# Patient Record
Sex: Male | Born: 1990 | Race: White | Hispanic: No | Marital: Single | Smoking: Current some day smoker
Health system: Southern US, Community
[De-identification: ages and names within clinical notes are randomized; demographics above are authoritative.]

## PROBLEM LIST (undated history)

## (undated) HISTORY — PX: CHOLECYSTECTOMY: SHX55

---
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
## 2011-01-12 ENCOUNTER — Emergency Department: Payer: Self-pay | Admitting: Emergency Medicine

## 2012-02-18 ENCOUNTER — Emergency Department: Payer: Self-pay | Admitting: *Deleted

## 2012-09-26 ENCOUNTER — Emergency Department: Payer: Self-pay | Admitting: Emergency Medicine

## 2013-09-27 ENCOUNTER — Emergency Department: Payer: Self-pay | Admitting: Emergency Medicine

## 2013-09-27 LAB — URINALYSIS, COMPLETE
Bacteria: NONE SEEN
Bilirubin,UR: NEGATIVE
Blood: NEGATIVE
Glucose,UR: NEGATIVE mg/dL (ref 0–75)
Ketone: NEGATIVE
LEUKOCYTE ESTERASE: NEGATIVE
Nitrite: NEGATIVE
PH: 6 (ref 4.5–8.0)
PROTEIN: NEGATIVE
RBC,UR: 1 /HPF (ref 0–5)
Specific Gravity: 1.024 (ref 1.003–1.030)

## 2013-10-07 ENCOUNTER — Emergency Department: Payer: Self-pay | Admitting: Emergency Medicine

## 2013-10-07 LAB — URINALYSIS, COMPLETE
BILIRUBIN, UR: NEGATIVE
Bacteria: NONE SEEN
Blood: NEGATIVE
Glucose,UR: NEGATIVE mg/dL (ref 0–75)
Ketone: NEGATIVE
Leukocyte Esterase: NEGATIVE
Nitrite: NEGATIVE
PROTEIN: NEGATIVE
Ph: 6 (ref 4.5–8.0)
SQUAMOUS EPITHELIAL: NONE SEEN
Specific Gravity: 1.025 (ref 1.003–1.030)
WBC UR: 1 /HPF (ref 0–5)

## 2014-06-29 ENCOUNTER — Emergency Department: Payer: Self-pay | Admitting: Emergency Medicine

## 2014-08-30 ENCOUNTER — Emergency Department: Payer: Self-pay | Admitting: Emergency Medicine

## 2014-08-30 LAB — CBC WITH DIFFERENTIAL/PLATELET
Basophil #: 0 10*3/uL (ref 0.0–0.1)
Basophil %: 0.5 %
Eosinophil #: 0.4 10*3/uL (ref 0.0–0.7)
Eosinophil %: 4.4 %
HCT: 45.5 % (ref 40.0–52.0)
HGB: 15.3 g/dL (ref 13.0–18.0)
Lymphocyte #: 1.7 10*3/uL (ref 1.0–3.6)
Lymphocyte %: 19.4 %
MCH: 28.2 pg (ref 26.0–34.0)
MCHC: 33.5 g/dL (ref 32.0–36.0)
MCV: 84 fL (ref 80–100)
Monocyte #: 0.9 x10 3/mm (ref 0.2–1.0)
Monocyte %: 10 %
Neutrophil #: 5.9 10*3/uL (ref 1.4–6.5)
Neutrophil %: 65.7 %
Platelet: 187 10*3/uL (ref 150–440)
RBC: 5.42 10*6/uL (ref 4.40–5.90)
RDW: 13.5 % (ref 11.5–14.5)
WBC: 9 10*3/uL (ref 3.8–10.6)

## 2014-08-30 LAB — URINALYSIS, COMPLETE
Bacteria: NONE SEEN
Bilirubin,UR: NEGATIVE
Blood: NEGATIVE
Glucose,UR: NEGATIVE mg/dL (ref 0–75)
Ketone: NEGATIVE
Leukocyte Esterase: NEGATIVE
Nitrite: NEGATIVE
Ph: 6 (ref 4.5–8.0)
Protein: 30
RBC,UR: 2 /HPF (ref 0–5)
Specific Gravity: 1.031 (ref 1.003–1.030)
Squamous Epithelial: NONE SEEN
WBC UR: 2 /HPF (ref 0–5)

## 2014-08-30 LAB — COMPREHENSIVE METABOLIC PANEL
Albumin: 3.6 g/dL (ref 3.4–5.0)
Alkaline Phosphatase: 87 U/L
Anion Gap: 6 — ABNORMAL LOW (ref 7–16)
BUN: 12 mg/dL (ref 7–18)
Bilirubin,Total: 0.7 mg/dL (ref 0.2–1.0)
Calcium, Total: 8.8 mg/dL (ref 8.5–10.1)
Chloride: 105 mmol/L (ref 98–107)
Co2: 31 mmol/L (ref 21–32)
Creatinine: 1.13 mg/dL (ref 0.60–1.30)
EGFR (African American): >60
EGFR (Non-African Amer.): >60
Glucose: 98 mg/dL (ref 65–99)
Osmolality: 283 (ref 275–301)
Potassium: 3.9 mmol/L (ref 3.5–5.1)
SGOT(AST): 33 U/L (ref 15–37)
SGPT (ALT): 41 U/L
Sodium: 142 mmol/L (ref 136–145)
Total Protein: 7.1 g/dL (ref 6.4–8.2)

## 2014-08-30 LAB — LIPASE, BLOOD: LIPASE: 61 U/L — AB (ref 73–393)

## 2014-08-30 LAB — TROPONIN I: Troponin-I: <0.02 ng/mL

## 2014-09-02 LAB — BETA STREP CULTURE(ARMC)

## 2014-10-09 ENCOUNTER — Emergency Department: Payer: Self-pay | Admitting: Emergency Medicine

## 2014-10-09 LAB — CBC WITH DIFFERENTIAL/PLATELET
BASOS PCT: 0.3 %
Basophil #: 0 10*3/uL (ref 0.0–0.1)
EOS PCT: 0 %
Eosinophil #: 0 10*3/uL (ref 0.0–0.7)
HCT: 41.7 % (ref 40.0–52.0)
HGB: 13.9 g/dL (ref 13.0–18.0)
Lymphocyte #: 1.4 10*3/uL (ref 1.0–3.6)
Lymphocyte %: 12.2 %
MCH: 27.9 pg (ref 26.0–34.0)
MCHC: 33.4 g/dL (ref 32.0–36.0)
MCV: 84 fL (ref 80–100)
MONOS PCT: 7.7 %
Monocyte #: 0.9 x10 3/mm (ref 0.2–1.0)
NEUTROS ABS: 9.3 10*3/uL — AB (ref 1.4–6.5)
Neutrophil %: 79.8 %
Platelet: 205 10*3/uL (ref 150–440)
RBC: 5 10*6/uL (ref 4.40–5.90)
RDW: 13.7 % (ref 11.5–14.5)
WBC: 11.6 10*3/uL — AB (ref 3.8–10.6)

## 2014-10-09 LAB — COMPREHENSIVE METABOLIC PANEL
ANION GAP: 8 (ref 7–16)
AST: 28 U/L (ref 15–37)
Albumin: 3.8 g/dL (ref 3.4–5.0)
Alkaline Phosphatase: 97 U/L (ref 46–116)
BILIRUBIN TOTAL: 0.7 mg/dL (ref 0.2–1.0)
BUN: 10 mg/dL (ref 7–18)
Calcium, Total: 8.9 mg/dL (ref 8.5–10.1)
Chloride: 105 mmol/L (ref 98–107)
Co2: 27 mmol/L (ref 21–32)
Creatinine: 1.05 mg/dL (ref 0.60–1.30)
EGFR (Non-African Amer.): >60
Glucose: 98 mg/dL (ref 65–99)
Osmolality: 278 (ref 275–301)
POTASSIUM: 3.7 mmol/L (ref 3.5–5.1)
SGPT (ALT): 29 U/L (ref 14–63)
Sodium: 140 mmol/L (ref 136–145)
TOTAL PROTEIN: 7.6 g/dL (ref 6.4–8.2)

## 2014-10-09 LAB — PROTEIN, CSF: PROTEIN, CSF: 42 mg/dL (ref 15–45)

## 2014-10-09 LAB — URINALYSIS, COMPLETE
Bacteria: NONE SEEN
Bilirubin,UR: NEGATIVE
Blood: NEGATIVE
Glucose,UR: NEGATIVE mg/dL (ref 0–75)
Ketone: NEGATIVE
Leukocyte Esterase: NEGATIVE
Nitrite: NEGATIVE
PH: 7 (ref 4.5–8.0)
PROTEIN: NEGATIVE
RBC,UR: 1 /HPF (ref 0–5)
SPECIFIC GRAVITY: 1.015 (ref 1.003–1.030)
Squamous Epithelial: NONE SEEN
WBC UR: <1 /HPF (ref 0–5)

## 2014-10-09 LAB — CSF CELL COUNT WITH DIFFERENTIAL
CSF TUBE #: 1
CSF Tube #: 3
EOS PCT: 0 %
Eosinophil: 0 %
Lymphocytes: 0 %
Lymphocytes: 0 %
Monocytes/Macrophages: 0 %
Monocytes/Macrophages: 0 %
NEUTROS PCT: 0 %
NEUTROS PCT: 0 %
OTHER CELLS: 0 %
OTHER CELLS: 0 %
RBC (CSF): 2 /mm3
RBC (CSF): 68 /mm3
WBC (CSF): 0 /mm3
WBC (CSF): 0 /mm3

## 2014-10-09 LAB — GLUCOSE, CSF: Glucose, CSF: 59 mg/dL (ref 40–75)

## 2014-10-12 LAB — CSF CULTURE W GRAM STAIN

## 2015-07-14 ENCOUNTER — Encounter: Payer: Self-pay | Admitting: Emergency Medicine

## 2015-07-14 ENCOUNTER — Emergency Department
Admission: EM | Admit: 2015-07-14 | Discharge: 2015-07-14 | Disposition: A | Discharge status: Home / Self Care | Payer: Self-pay | Attending: Emergency Medicine | Admitting: Emergency Medicine

## 2015-07-14 DIAGNOSIS — R0602 Shortness of breath: Secondary | ICD-10-CM | POA: Insufficient documentation

## 2015-07-14 DIAGNOSIS — R531 Weakness: Secondary | ICD-10-CM | POA: Insufficient documentation

## 2015-07-14 DIAGNOSIS — F1210 Cannabis abuse, uncomplicated: Secondary | ICD-10-CM | POA: Insufficient documentation

## 2015-07-14 DIAGNOSIS — Z720 Tobacco use: Secondary | ICD-10-CM | POA: Insufficient documentation

## 2015-07-14 DIAGNOSIS — R55 Syncope and collapse: Secondary | ICD-10-CM | POA: Insufficient documentation

## 2015-07-14 DIAGNOSIS — H538 Other visual disturbances: Secondary | ICD-10-CM | POA: Insufficient documentation

## 2015-07-14 DIAGNOSIS — R112 Nausea with vomiting, unspecified: Secondary | ICD-10-CM | POA: Insufficient documentation

## 2015-07-14 LAB — COMPREHENSIVE METABOLIC PANEL
ALT: 17 U/L (ref 17–63)
AST: 19 U/L (ref 15–41)
Albumin: 3.9 g/dL (ref 3.5–5.0)
Alkaline Phosphatase: 67 U/L (ref 38–126)
Anion gap: 5 (ref 5–15)
BILIRUBIN TOTAL: 0.6 mg/dL (ref 0.3–1.2)
BUN: 14 mg/dL (ref 6–20)
CHLORIDE: 104 mmol/L (ref 101–111)
CO2: 31 mmol/L (ref 22–32)
CREATININE: 0.93 mg/dL (ref 0.61–1.24)
Calcium: 8.8 mg/dL — ABNORMAL LOW (ref 8.9–10.3)
GFR calc Af Amer: >60 mL/min (ref >60)
Glucose, Bld: 87 mg/dL (ref 65–99)
Potassium: 4.2 mmol/L (ref 3.5–5.1)
Sodium: 140 mmol/L (ref 135–145)
Total Protein: 6.6 g/dL (ref 6.5–8.1)

## 2015-07-14 LAB — URINE DRUG SCREEN, QUALITATIVE (ARMC ONLY)
AMPHETAMINES, UR SCREEN: NOT DETECTED
Barbiturates, Ur Screen: NOT DETECTED
Benzodiazepine, Ur Scrn: NOT DETECTED
COCAINE METABOLITE, UR ~~LOC~~: NOT DETECTED
Cannabinoid 50 Ng, Ur ~~LOC~~: POSITIVE — AB
MDMA (Ecstasy)Ur Screen: NOT DETECTED
METHADONE SCREEN, URINE: NOT DETECTED
OPIATE, UR SCREEN: NOT DETECTED
Phencyclidine (PCP) Ur S: NOT DETECTED
Tricyclic, Ur Screen: NOT DETECTED

## 2015-07-14 LAB — LIPASE, BLOOD: LIPASE: 21 U/L (ref 11–51)

## 2015-07-14 LAB — URINALYSIS COMPLETE WITH MICROSCOPIC (ARMC ONLY)
BILIRUBIN URINE: NEGATIVE
Bacteria, UA: NONE SEEN
GLUCOSE, UA: NEGATIVE mg/dL
Hgb urine dipstick: NEGATIVE
Ketones, ur: NEGATIVE mg/dL
Leukocytes, UA: NEGATIVE
Nitrite: NEGATIVE
Protein, ur: NEGATIVE mg/dL
Specific Gravity, Urine: 1.02 (ref 1.005–1.030)
pH: 6 (ref 5.0–8.0)

## 2015-07-14 LAB — CBC WITH DIFFERENTIAL/PLATELET
BASOS ABS: 0 10*3/uL (ref 0–0.1)
Basophils Relative: 1 %
EOS PCT: 1 %
Eosinophils Absolute: 0.1 10*3/uL (ref 0–0.7)
HCT: 47.9 % (ref 40.0–52.0)
Hemoglobin: 16.5 g/dL (ref 13.0–18.0)
LYMPHS ABS: 2.2 10*3/uL (ref 1.0–3.6)
LYMPHS PCT: 25 %
MCH: 28.4 pg (ref 26.0–34.0)
MCHC: 34.4 g/dL (ref 32.0–36.0)
MCV: 82.7 fL (ref 80.0–100.0)
MONO ABS: 0.6 10*3/uL (ref 0.2–1.0)
Monocytes Relative: 7 %
Neutro Abs: 5.8 10*3/uL (ref 1.4–6.5)
Neutrophils Relative %: 66 %
PLATELETS: 174 10*3/uL (ref 150–440)
RBC: 5.8 MIL/uL (ref 4.40–5.90)
RDW: 13.4 % (ref 11.5–14.5)
WBC: 8.7 10*3/uL (ref 3.8–10.6)

## 2015-07-14 MED ORDER — ONDANSETRON HCL 4 MG/2ML IJ SOLN
4.0000 mg | Freq: Once | INTRAMUSCULAR | Status: AC
  Administered 2015-07-14: 4 mg via INTRAVENOUS
  Filled 2015-07-14: qty 2

## 2015-07-14 MED ORDER — METOCLOPRAMIDE HCL 10 MG PO TABS: 10.0000 mg | ORAL_TABLET | Freq: Four times a day (QID) | ORAL | Status: DC | PRN

## 2015-07-14 MED ORDER — SODIUM CHLORIDE 0.9 % IV BOLUS (SEPSIS)
1000.0000 mL | Freq: Once | INTRAVENOUS | Status: AC
  Administered 2015-07-14: 1000 mL via INTRAVENOUS

## 2015-07-14 NOTE — Discharge Instructions (Signed)

## 2015-07-14 NOTE — ED Notes (Addendum)
Per MD request, PO challenge started. Pt was provided ginger ale per pt request. Will monitor pt intake and response.

## 2015-07-14 NOTE — ED Notes (Addendum)
Pt was ambulatory with a steady gait upon D/C. Pt's questions were answered and pt verbalized no further questions. Pt received discharge instructions and prescription and education about where to fill his prescription. Pt also received education about [facility].

## 2015-07-14 NOTE — ED Provider Notes (Signed)
[facility] Emergency Department Provider Note  ____________________________________________  Time seen: Approximately 6:45 PM  I have reviewed the triage vital signs and the nursing notes.   HISTORY  Chief Complaint Emesis    HPI Jackson Jimenez is a 24 y.o. male without any chronic medical issues who is presenting today with vomiting, weakness and blurred vision since yesterday. He says that he thinks this all began yesterday when he cooked eggs that had been in his refrigerator since July. He says that he vomited once. He says that he has been nauseous since denies any further vomiting. He says that he feels like his eyes are crossing and uncrossing and that is blurring his vision. Denies any blurred vision at this time. He denies any pain such as chest pain or abdominal pain. Is complaining of some shortness of breath with exertion. Also says that he had one episode of burning with urination after drinking a Coca-Cola. He denies any illicit drugs. Says that he had about 3 drinks of alcohol over the weekend. Says that he smokes every day. Says that earlier today he also had an episode where he thought he was going to pass out. He says that he felt "hot." However, did not feel his heart racing or any pain during this episode. Says that the symptoms were not worsened after he gave plasma today.   History reviewed. No pertinent past medical history.  There are no active problems to display for this patient.   History reviewed. No pertinent past surgical history.  No current outpatient prescriptions on file.  Allergies Albuterol  History reviewed. No pertinent family history.  Social History Social History  Substance Use Topics  . Smoking status: Current Every Day Smoker    Types: Cigarettes  . Smokeless tobacco: None  . Alcohol Use: No    Review of Systems Constitutional: No fever/chills Eyes: As above ENT: No sore throat. Cardiovascular: Denies  chest pain. Respiratory: As above  Gastrointestinal: No abdominal pain.    No diarrhea.  No constipation. Genitourinary: Negative for dysuria. Musculoskeletal: Negative for back pain. Skin: Negative for rash. Neurological: Negative for headaches, focal weakness or numbness.  10-point ROS otherwise negative.  ____________________________________________   PHYSICAL EXAM:  VITAL SIGNS: ED Triage Vitals  Enc Vitals Group     BP 07/14/15 1824 105/76 mmHg     Pulse Rate 07/14/15 1824 75     Resp 07/14/15 1824 16     Temp 07/14/15 1828 97.6 F (36.4 C)     Temp Source 07/14/15 1824 Oral     SpO2 07/14/15 1824 97 %     Weight 07/14/15 1824 229 lb (103.874 kg)     Height 07/14/15 1824 6\' 1"  (1.854 m)     Head Cir --      Peak Flow --      Pain Score --      Pain Loc --      Pain Edu? --      Excl. in GC? --     Constitutional: Alert and oriented. Well appearing and in no acute distress. Eyes: Conjunctivae are normal. PERRL. EOMI. Head: Atraumatic. Nose: No congestion/rhinnorhea. Mouth/Throat: Mucous membranes are moist.  Oropharynx non-erythematous. Neck: No stridor.   Cardiovascular: Normal rate, regular rhythm. Grossly normal heart sounds.  Good peripheral circulation. Respiratory: Normal respiratory effort.  No retractions. Lungs CTAB. Gastrointestinal: Soft and nontender. No distention. No abdominal bruits. No CVA tenderness. Musculoskeletal: No lower extremity tenderness nor edema.  No joint  effusions. Neurologic:  Normal speech and language. No gross focal neurologic deficits are appreciated. No gait instability. Skin:  Skin is warm, dry and intact. No rash noted. Psychiatric: Mood and affect are normal. Speech and behavior are normal.  ____________________________________________   LABS (all labs ordered are listed, but only abnormal results are displayed)  Labs Reviewed  COMPREHENSIVE METABOLIC PANEL - Abnormal; Notable for the following:    Calcium 8.8 (*)     All other components within normal limits  URINALYSIS COMPLETEWITH MICROSCOPIC ([facility] ONLY) - Abnormal; Notable for the following:    Color, Urine YELLOW (*)    APPearance CLEAR (*)    Squamous Epithelial / LPF 0-5 (*)    All other components within normal limits  URINE DRUG SCREEN, QUALITATIVE ([facility] ONLY) - Abnormal; Notable for the following:    Cannabinoid 50 Ng, Ur Butler POSITIVE (*)    All other components within normal limits  LIPASE, BLOOD  CBC WITH DIFFERENTIAL/PLATELET   ____________________________________________  EKG  ED ECG REPORT I, Mack Thurmon,  Teena Irani, the attending physician, personally viewed and interpreted this ECG.   Date: 07/14/2015  EKG Time: 1918  Rate: 67  Rhythm: normal EKG, normal sinus rhythm  Axis: Normal axis  Intervals:none  ST&T Change: No ST segment elevation or depression. No abnormal T-wave inversions.  ____________________________________________  RADIOLOGY   ____________________________________________   PROCEDURES    ____________________________________________   INITIAL IMPRESSION / ASSESSMENT AND PLAN / ED COURSE  Pertinent labs & imaging results that were available during my care of the patient were reviewed by me and considered in my medical decision making (see chart for details).  ----------------------------------------- 8:02 PM on 07/14/2015 -----------------------------------------  Patient is feeling improved at this time and was able to tolerate by mouth after fluids and Zofran. Has a very reassuring workup including his labs, urine and EKG. He says that he does have a roommate who is sick at this time with a very bad cold. It is possible he got a viral illness from the roommate or he is having malaise from food poisoning. We discussed using his way back into normal diet with bland foods such as toast and chicken soup. We also discussed the importance of fluid hydration including drinking plenty of water. The patient has  not complained of any further episodes of blurred vision. He'll be discharged to home. Patient was able to ambulate with a normal gait bathroom and back without any assistance. Likely near syncopal episodes related to vasovagal response from illness. ____________________________________________   FINAL CLINICAL IMPRESSION(S) / ED DIAGNOSES  Near syncope. Nausea and vomiting.    Myrna Blazer, MD 07/14/15 2004

## 2015-07-14 NOTE — ED Notes (Signed)
Pt complains of vomiting and blurred vision since yesterday. Pt gives plasma regularly and last gave today at 2pm. Pt stable at this time.

## 2015-11-22 ENCOUNTER — Emergency Department: Payer: Self-pay

## 2015-11-22 ENCOUNTER — Emergency Department
Admission: EM | Admit: 2015-11-22 | Discharge: 2015-11-22 | Disposition: A | Discharge status: Home / Self Care | Payer: Self-pay | Attending: Emergency Medicine | Admitting: Emergency Medicine

## 2015-11-22 DIAGNOSIS — Y9389 Activity, other specified: Secondary | ICD-10-CM | POA: Insufficient documentation

## 2015-11-22 DIAGNOSIS — M25561 Pain in right knee: Secondary | ICD-10-CM | POA: Insufficient documentation

## 2015-11-22 DIAGNOSIS — Y999 Unspecified external cause status: Secondary | ICD-10-CM | POA: Insufficient documentation

## 2015-11-22 DIAGNOSIS — S0083XA Contusion of other part of head, initial encounter: Secondary | ICD-10-CM | POA: Insufficient documentation

## 2015-11-22 DIAGNOSIS — Y92410 Unspecified street and highway as the place of occurrence of the external cause: Secondary | ICD-10-CM | POA: Insufficient documentation

## 2015-11-22 MED ORDER — IBUPROFEN 800 MG PO TABS
800.0000 mg | ORAL_TABLET | Freq: Once | ORAL | Status: DC
  Filled 2015-11-22: qty 1

## 2015-11-22 MED ORDER — IBUPROFEN 800 MG PO TABS: 800.0000 mg | ORAL_TABLET | Freq: Three times a day (TID) | ORAL | Status: DC | PRN

## 2015-11-22 MED ORDER — CYCLOBENZAPRINE HCL 10 MG PO TABS: 10.0000 mg | ORAL_TABLET | Freq: Three times a day (TID) | ORAL | Status: DC | PRN
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
## 2015-11-22 NOTE — Discharge Instructions (Signed)
Facial or Scalp Contusion °A facial or scalp contusion is a deep bruise on the face or head. Injuries to the face and head generally cause a lot of swelling, especially around the eyes. Contusions are the result of an injury that caused bleeding under the skin. The contusion may turn blue, purple, or yellow. Minor injuries will give you a painless contusion, but more severe contusions may stay painful and swollen for a few weeks.  °CAUSES  °A facial or scalp contusion is caused by a blunt injury or trauma to the face or head area.  °SIGNS AND SYMPTOMS  °· Swelling of the injured area.   °· Discoloration of the injured area.   °· Tenderness, soreness, or pain in the injured area.   °DIAGNOSIS  °The diagnosis can be made by taking a medical history and doing a physical exam. An X-ray exam, CT scan, or MRI may be needed to determine if there are any associated injuries, such as broken bones (fractures). °TREATMENT  °Often, the best treatment for a facial or scalp contusion is applying cold compresses to the injured area. Over-the-counter medicines may also be recommended for pain control.  °HOME CARE INSTRUCTIONS  °· Only take over-the-counter or prescription medicines as directed by your health care provider.   °· Apply ice to the injured area.   °· Put ice in a plastic bag.   °· Place a towel between your skin and the bag.   °· Leave the ice on for 20 minutes, 2-3 times a day.   °SEEK MEDICAL CARE IF: °· You have bite problems.   °· You have pain with chewing.   °· You are concerned about facial defects. °SEEK IMMEDIATE MEDICAL CARE IF: °· You have severe pain or a headache that is not relieved by medicine.   °· You have unusual sleepiness, confusion, or personality changes.   °· You throw up (vomit).   °· You have a persistent nosebleed.   °· You have double vision or blurred vision.   °· You have fluid drainage from your nose or ear.   °· You have difficulty walking or using your arms or legs.   °MAKE SURE YOU:   °· Understand these instructions. °· Will watch your condition. °· Will get help right away if you are not doing well or get worse. °  °This information is not intended to replace advice given to you by your health care provider. Make sure you discuss any questions you have with your health care provider. °  °Document Released: 10/06/2004 Document Revised: 09/19/2014 Document Reviewed: 04/11/2013 °Elsevier Interactive Patient Education ©2016 [employer] ° °Motor Vehicle Collision °It is common to have multiple bruises and sore muscles after a motor vehicle collision (MVC). These tend to feel worse for the first 24 hours. You may have the most stiffness and soreness over the first several hours. You may also feel worse when you wake up the first morning after your collision. After this point, you will usually begin to improve with each day. The speed of improvement often depends on the severity of the collision, the number of injuries, and the location and nature of these injuries. °HOME CARE INSTRUCTIONS °· Put ice on the injured area. °¨ Put ice in a plastic bag. °¨ Place a towel between your skin and the bag. °¨ Leave the ice on for 15-20 minutes, 3-4 times a day, or as directed by your health care provider. °· Drink enough fluids to keep your urine clear or pale yellow. Do not drink alcohol. °· Take a warm shower or bath once or twice   a day. This will increase blood flow to sore muscles. °· You may return to activities as directed by your caregiver. Be careful when lifting, as this may aggravate neck or back pain. °· Only take over-the-counter or prescription medicines for pain, discomfort, or fever as directed by your caregiver. Do not use aspirin. This may increase bruising and bleeding. °SEEK IMMEDIATE MEDICAL CARE IF: °· You have numbness, tingling, or weakness in the arms or legs. °· You develop severe headaches not relieved with medicine. °· You have severe neck pain, especially tenderness in the middle  of the back of your neck. °· You have changes in bowel or bladder control. °· There is increasing pain in any area of the body. °· You have shortness of breath, light-headedness, dizziness, or fainting. °· You have chest pain. °· You feel sick to your stomach (nauseous), throw up (vomit), or sweat. °· You have increasing abdominal discomfort. °· There is blood in your urine, stool, or vomit. °· You have pain in your shoulder (shoulder strap areas). °· You feel your symptoms are getting worse. °MAKE SURE YOU: °· Understand these instructions. °· Will watch your condition. °· Will get help right away if you are not doing well or get worse. °  °This information is not intended to replace advice given to you by your health care provider. Make sure you discuss any questions you have with your health care provider. °  °Document Released: 08/29/2005 Document Revised: 09/19/2014 Document Reviewed: 01/26/2011 °Elsevier Interactive Patient Education ©2016 [employer] ° °

## 2015-12-18 ENCOUNTER — Emergency Department
Admission: EM | Admit: 2015-12-18 | Discharge: 2015-12-18 | Disposition: A | Discharge status: Home / Self Care | Payer: Self-pay | Attending: Emergency Medicine | Admitting: Emergency Medicine

## 2015-12-18 DIAGNOSIS — M5412 Radiculopathy, cervical region: Secondary | ICD-10-CM | POA: Insufficient documentation

## 2015-12-18 DIAGNOSIS — F17210 Nicotine dependence, cigarettes, uncomplicated: Secondary | ICD-10-CM | POA: Insufficient documentation

## 2015-12-18 DIAGNOSIS — Z791 Long term (current) use of non-steroidal anti-inflammatories (NSAID): Secondary | ICD-10-CM | POA: Insufficient documentation

## 2015-12-18 DIAGNOSIS — Z79899 Other long term (current) drug therapy: Secondary | ICD-10-CM | POA: Insufficient documentation

## 2015-12-18 MED ORDER — KETOROLAC TROMETHAMINE 30 MG/ML IJ SOLN
30.0000 mg | Freq: Once | INTRAMUSCULAR | Status: AC
  Administered 2015-12-18: 30 mg via INTRAMUSCULAR
  Filled 2015-12-18: qty 1

## 2015-12-18 MED ORDER — MELOXICAM 15 MG PO TABS: 15.0000 mg | ORAL_TABLET | Freq: Every day | ORAL | Status: DC

## 2015-12-18 NOTE — ED Notes (Signed)
Pt is tender to touch at the left shoulder
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
## 2015-12-18 NOTE — ED Notes (Signed)
Pt states that today he used the bathroom and then sighed, pt states that he started to have excruciating pain in the center of his back, pt states that the pain wrapped around to his center, pt states that it took his breath and does when he moves a certain way, feels the pain in the ribs bilat, pt reports doing a lot of exercise last week but denies injury

## 2015-12-18 NOTE — ED Notes (Signed)
Pt arrives via ems for sudden onset of mid back pain that took his breath away, pt denies injury, reports doing 5 hours worth of exercise last week without injury, pt states that after he used the bathroom today he let out a big sigh that created an excruciating pain in the center of his back that radiates around to bilat ribs, pt reports a squeezing sensation in the center of his chest when it happened, pt states that with the ride here he has settled down and the pain has eased to a 3/10

## 2015-12-18 NOTE — Discharge Instructions (Signed)

## 2016-01-29 ENCOUNTER — Emergency Department: Payer: Self-pay

## 2016-01-29 ENCOUNTER — Emergency Department
Admission: EM | Admit: 2016-01-29 | Discharge: 2016-01-29 | Disposition: A | Discharge status: Home / Self Care | Payer: Self-pay | Attending: Emergency Medicine | Admitting: Emergency Medicine

## 2016-01-29 ENCOUNTER — Encounter: Payer: Self-pay | Admitting: Emergency Medicine

## 2016-01-29 DIAGNOSIS — Z87891 Personal history of nicotine dependence: Secondary | ICD-10-CM | POA: Insufficient documentation

## 2016-01-29 DIAGNOSIS — Y929 Unspecified place or not applicable: Secondary | ICD-10-CM | POA: Insufficient documentation

## 2016-01-29 DIAGNOSIS — X501XXA Overexertion from prolonged static or awkward postures, initial encounter: Secondary | ICD-10-CM | POA: Insufficient documentation

## 2016-01-29 DIAGNOSIS — Y9389 Activity, other specified: Secondary | ICD-10-CM | POA: Insufficient documentation

## 2016-01-29 DIAGNOSIS — Y990 Civilian activity done for income or pay: Secondary | ICD-10-CM | POA: Insufficient documentation

## 2016-01-29 DIAGNOSIS — S39012A Strain of muscle, fascia and tendon of lower back, initial encounter: Secondary | ICD-10-CM | POA: Insufficient documentation

## 2016-01-29 DIAGNOSIS — Z791 Long term (current) use of non-steroidal anti-inflammatories (NSAID): Secondary | ICD-10-CM | POA: Insufficient documentation

## 2016-01-29 LAB — URINALYSIS COMPLETE WITH MICROSCOPIC (ARMC ONLY)
Bilirubin Urine: NEGATIVE
GLUCOSE, UA: NEGATIVE mg/dL
Hgb urine dipstick: NEGATIVE
Ketones, ur: NEGATIVE mg/dL
Leukocytes, UA: NEGATIVE
NITRITE: NEGATIVE
Protein, ur: NEGATIVE mg/dL
SPECIFIC GRAVITY, URINE: 1.027 (ref 1.005–1.030)
pH: 6 (ref 5.0–8.0)

## 2016-01-29 MED ORDER — CYCLOBENZAPRINE HCL 5 MG PO TABS: 5.0000 mg | ORAL_TABLET | Freq: Three times a day (TID) | ORAL | Status: DC | PRN

## 2016-01-29 MED ORDER — KETOROLAC TROMETHAMINE 60 MG/2ML IM SOLN
30.0000 mg | Freq: Once | INTRAMUSCULAR | Status: AC
  Administered 2016-01-29: 30 mg via INTRAMUSCULAR
  Filled 2016-01-29: qty 2

## 2016-01-29 MED ORDER — KETOROLAC TROMETHAMINE 10 MG PO TABS: 10.0000 mg | ORAL_TABLET | Freq: Three times a day (TID) | ORAL | Status: DC

## 2016-01-29 NOTE — ED Notes (Addendum)
Patient to ER for c/o lower back pain. States "I started to speed up walking and felt a pop in my lower back". Patient arrived via [facility]. States pain shoots down to legs.

## 2016-01-29 NOTE — Discharge Instructions (Signed)
Back Injury Prevention °Back injuries can be very painful. They can also be difficult to heal. After having one back injury, you are more likely to injure your back again. It is important to learn how to avoid injuring or re-injuring your back. The following tips can help you to prevent a back injury. °WHAT SHOULD I KNOW ABOUT PHYSICAL FITNESS? °· Exercise for 30 minutes per day on most days of the week or as directed by your health care provider. Make sure to: °· Do aerobic exercises, such as walking, jogging, biking, or swimming. °· Do exercises that increase balance and strength, such as tai chi and yoga. These can decrease your risk of falling and injuring your back. °· Do stretching exercises to help with flexibility. °· Try to develop strong abdominal muscles. Your abdominal muscles provide a lot of the support that is needed by your back. °· Maintain a healthy weight.  This helps to decrease your risk of a back injury. °WHAT SHOULD I KNOW ABOUT MY DIET? °· Talk with your health care provider about your overall diet. Take supplements and vitamins only as directed by your health care provider. °· Talk with your health care provider about how much calcium and vitamin D you need each day. These nutrients help to prevent weakening of the bones (osteoporosis). Osteoporosis can cause broken (fractured) bones, which lead to back pain. °· Include good sources of calcium in your diet, such as dairy products, green leafy vegetables, and products that have had calcium added to them (fortified). °· Include good sources of vitamin D in your diet, such as milk and foods that are fortified with vitamin D. °WHAT SHOULD I KNOW ABOUT MY POSTURE? °· Sit up straight and stand up straight. Avoid leaning forward when you sit or hunching over when you stand. °· Choose chairs that have good low-back (lumbar) support. °· If you work at a desk, sit close to it so you do not need to lean over. Keep your chin tucked in. Keep your neck  drawn back, and keep your elbows bent at a right angle. Your arms should look like the letter "L." °· Sit high and close to the steering wheel when you drive. Add a lumbar support to your car seat, if needed. °· Avoid sitting or standing in one position for very long. Take breaks to get up, stretch, and walk around at least one time every hour. Take breaks every hour if you are driving for long periods of time. °· Sleep on your side with your knees slightly bent, or sleep on your back with a pillow under your knees. Do not lie on the front of your body to sleep. °WHAT SHOULD I KNOW ABOUT LIFTING, TWISTING, AND REACHING? °Lifting and Heavy Lifting °· Avoid heavy lifting, especially repetitive heavy lifting. If you must do heavy lifting: °· Stretch before lifting. °· Work slowly. °· Rest between lifts. °· Use a tool such as a cart or a dolly to move objects if one is available. °· Make several small trips instead of carrying one heavy load. °· Ask for help when you need it, especially when moving big objects. °· Follow these steps when lifting: °· Stand with your feet shoulder-width apart. °· Get as close to the object as you can. Do not try to pick up a heavy object that is far from your body. °· Use handles or lifting straps if they are available. °· Bend at your knees. Squat down, but keep your heels off the floor. °·   Keep your shoulders pulled back, your chin tucked in, and your back straight. °· Lift the object slowly while you tighten the muscles in your legs, abdomen, and buttocks. Keep the object as close to the center of your body as possible. °· Follow these steps when putting down a heavy load: °· Stand with your feet shoulder-width apart. °· Lower the object slowly while you tighten the muscles in your legs, abdomen, and buttocks. Keep the object as close to the center of your body as possible. °· Keep your shoulders pulled back, your chin tucked in, and your back straight. °· Bend at your knees. Squat  down, but keep your heels off the floor. °· Use handles or lifting straps if they are available. °Twisting and Reaching °· Avoid lifting heavy objects above your waist. °· Do not twist at your waist while you are lifting or carrying a load. If you need to turn, move your feet. °· Do not bend over without bending at your knees. °· Avoid reaching over your head, across a table, or for an object on a high surface. °WHAT ARE SOME OTHER TIPS? °· Avoid wet floors and icy ground. Keep sidewalks clear of ice to prevent falls. °· Do not sleep on a mattress that is too soft or too hard. °· Keep items that are used frequently within easy reach. °· Put heavier objects on shelves at waist level, and put lighter objects on lower or higher shelves. °· Find ways to decrease your stress, such as exercise, massage, or relaxation techniques. Stress can build up in your muscles. Tense muscles are more vulnerable to injury. °· Talk with your health care provider if you feel anxious or depressed. These conditions can make back pain worse. °· Wear flat heel shoes with cushioned soles. °· Avoid sudden movements. °· Use both shoulder straps when carrying a backpack. °· Do not use any tobacco products, including cigarettes, chewing tobacco, or electronic cigarettes. If you need help quitting, ask your health care provider. °  °This information is not intended to replace advice given to you by your health care provider. Make sure you discuss any questions you have with your health care provider. °  °Document Released: 10/06/2004 Document Revised: 01/13/2015 Document Reviewed: 09/02/2014 °Elsevier Interactive Patient Education ©2016 [employer] ° °Lumbosacral Strain °Lumbosacral strain is a strain of any of the parts that make up your lumbosacral vertebrae. Your lumbosacral vertebrae are the bones that make up the lower third of your backbone. Your lumbosacral vertebrae are held together by muscles and tough, fibrous tissue (ligaments).    °CAUSES  °A sudden blow to your back can cause lumbosacral strain. Also, anything that causes an excessive stretch of the muscles in the low back can cause this strain. This is typically seen when people exert themselves strenuously, fall, lift heavy objects, bend, or crouch repeatedly. °RISK FACTORS °· Physically demanding work. °· Participation in pushing or pulling sports or sports that require a sudden twist of the back (tennis, golf, baseball). °· Weight lifting. °· Excessive lower back curvature. °· Forward-tilted pelvis. °· Weak back or abdominal muscles or both. °· Tight hamstrings. °SIGNS AND SYMPTOMS  °Lumbosacral strain may cause pain in the area of your injury or pain that moves (radiates) down your leg.  °DIAGNOSIS °Your health care provider can often diagnose lumbosacral strain through a physical exam. In some cases, you may need tests such as X-ray exams.  °TREATMENT  °Treatment for your lower back injury depends on many factors that   your clinician will have to evaluate. However, most treatment will include the use of anti-inflammatory medicines. HOME CARE INSTRUCTIONS   Avoid hard physical activities (tennis, racquetball, waterskiing) if you are not in proper physical condition for it. This may aggravate or create problems.  If you have a back problem, avoid sports requiring sudden body movements. Swimming and walking are generally safer activities.  Maintain good posture.  Maintain a healthy weight.  For acute conditions, you may put ice on the injured area.  Put ice in a plastic bag.  Place a towel between your skin and the bag.  Leave the ice on for 20 minutes, 2-3 times a day.  When the low back starts healing, stretching and strengthening exercises may be recommended. SEEK MEDICAL CARE IF:  Your back pain is getting worse.  You experience severe back pain not relieved with medicines. SEEK IMMEDIATE MEDICAL CARE IF:   You have numbness, tingling, weakness, or problems  with the use of your arms or legs.  There is a change in bowel or bladder control.  You have increasing pain in any area of the body, including your belly (abdomen).  You notice shortness of breath, dizziness, or feel faint.  You feel sick to your stomach (nauseous), are throwing up (vomiting), or become sweaty.  You notice discoloration of your toes or legs, or your feet get very cold. MAKE SURE YOU:   Understand these instructions.  Will watch your condition.  Will get help right away if you are not doing well or get worse.   This information is not intended to replace advice given to you by your health care provider. Make sure you discuss any questions you have with your health care provider.   Document Released: 06/08/2005 Document Revised: 09/19/2014 Document Reviewed: 04/17/2013 Elsevier Interactive Patient Education 2016 [place] the prescription meds as directed. Hold the previously prescribed Meloxicam, while dosing the Ketorolac. Apply ice compresses to the back for additional pain relief. Follow-up with [facility] for continued symptom management.

## 2016-01-29 NOTE — ED Notes (Signed)
Patient left for xray.
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
## 2016-06-09 ENCOUNTER — Encounter: Payer: Self-pay | Admitting: Emergency Medicine

## 2016-06-09 ENCOUNTER — Emergency Department
Admission: EM | Admit: 2016-06-09 | Discharge: 2016-06-09 | Disposition: A | Discharge status: Home / Self Care | Payer: Self-pay | Attending: Emergency Medicine | Admitting: Emergency Medicine

## 2016-06-09 DIAGNOSIS — G8929 Other chronic pain: Secondary | ICD-10-CM | POA: Insufficient documentation

## 2016-06-09 DIAGNOSIS — M5442 Lumbago with sciatica, left side: Secondary | ICD-10-CM | POA: Insufficient documentation

## 2016-06-09 DIAGNOSIS — M549 Dorsalgia, unspecified: Secondary | ICD-10-CM

## 2016-06-09 DIAGNOSIS — Z87891 Personal history of nicotine dependence: Secondary | ICD-10-CM | POA: Insufficient documentation

## 2016-06-09 LAB — URINALYSIS COMPLETE WITH MICROSCOPIC (ARMC ONLY)
BILIRUBIN URINE: NEGATIVE
Bacteria, UA: NONE SEEN
Glucose, UA: NEGATIVE mg/dL
HGB URINE DIPSTICK: NEGATIVE
KETONES UR: NEGATIVE mg/dL
LEUKOCYTES UA: NEGATIVE
Nitrite: NEGATIVE
PH: 7 (ref 5.0–8.0)
Protein, ur: NEGATIVE mg/dL
SPECIFIC GRAVITY, URINE: 1.017 (ref 1.005–1.030)
Squamous Epithelial / LPF: NONE SEEN

## 2016-06-09 MED ORDER — KETOROLAC TROMETHAMINE 30 MG/ML IJ SOLN
30.0000 mg | Freq: Once | INTRAMUSCULAR | Status: AC
  Administered 2016-06-09: 30 mg via INTRAMUSCULAR
  Filled 2016-06-09: qty 1

## 2016-06-09 MED ORDER — DICLOFENAC SODIUM 50 MG PO TBEC: 50.0000 mg | DELAYED_RELEASE_TABLET | Freq: Two times a day (BID) | ORAL | 0 refills | Status: DC

## 2016-06-09 NOTE — ED Notes (Signed)
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 Patient states pain is at lower back and radiates forward. Pain does not currently radiate, but patient reports radiating down left leg yesterday.
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
## 2016-09-02 ENCOUNTER — Emergency Department: Payer: Self-pay

## 2016-09-02 ENCOUNTER — Emergency Department
Admission: EM | Admit: 2016-09-02 | Discharge: 2016-09-02 | Disposition: A | Discharge status: Home / Self Care | Payer: Self-pay | Attending: Student in an Organized Health Care Education/Training Program | Admitting: Student in an Organized Health Care Education/Training Program

## 2016-09-02 DIAGNOSIS — Z87891 Personal history of nicotine dependence: Secondary | ICD-10-CM | POA: Insufficient documentation

## 2016-09-02 DIAGNOSIS — L089 Local infection of the skin and subcutaneous tissue, unspecified: Secondary | ICD-10-CM | POA: Insufficient documentation

## 2016-09-02 DIAGNOSIS — S40021A Contusion of right upper arm, initial encounter: Secondary | ICD-10-CM | POA: Insufficient documentation

## 2016-09-02 DIAGNOSIS — Z79899 Other long term (current) drug therapy: Secondary | ICD-10-CM | POA: Insufficient documentation

## 2016-09-02 DIAGNOSIS — N23 Unspecified renal colic: Secondary | ICD-10-CM | POA: Insufficient documentation

## 2016-09-02 DIAGNOSIS — Y999 Unspecified external cause status: Secondary | ICD-10-CM | POA: Insufficient documentation

## 2016-09-02 DIAGNOSIS — Y92410 Unspecified street and highway as the place of occurrence of the external cause: Secondary | ICD-10-CM | POA: Insufficient documentation

## 2016-09-02 DIAGNOSIS — B958 Unspecified staphylococcus as the cause of diseases classified elsewhere: Secondary | ICD-10-CM

## 2016-09-02 DIAGNOSIS — X58XXXA Exposure to other specified factors, initial encounter: Secondary | ICD-10-CM | POA: Insufficient documentation

## 2016-09-02 DIAGNOSIS — Y9389 Activity, other specified: Secondary | ICD-10-CM | POA: Insufficient documentation

## 2016-09-02 DIAGNOSIS — S20211A Contusion of right front wall of thorax, initial encounter: Secondary | ICD-10-CM | POA: Insufficient documentation

## 2016-09-02 MED ORDER — TRAMADOL HCL 50 MG PO TABS: 50.0000 mg | ORAL_TABLET | Freq: Four times a day (QID) | ORAL | 0 refills | Status: DC | PRN

## 2016-09-02 MED ORDER — NAPROXEN 500 MG PO TABS
500.0000 mg | ORAL_TABLET | Freq: Once | ORAL | Status: AC
  Administered 2016-09-02: 500 mg via ORAL
  Filled 2016-09-02: qty 1

## 2016-09-02 MED ORDER — SULFAMETHOXAZOLE-TRIMETHOPRIM 800-160 MG PO TABS
1.0000 | ORAL_TABLET | Freq: Once | ORAL | Status: AC
  Administered 2016-09-02: 1 via ORAL
  Filled 2016-09-02: qty 1

## 2016-09-02 MED ORDER — BACITRACIN-NEOMYCIN-POLYMYXIN 400-5-5000 EX OINT
TOPICAL_OINTMENT | Freq: Once | CUTANEOUS | Status: AC
  Administered 2016-09-02: 1 via TOPICAL
  Filled 2016-09-02: qty 1

## 2016-09-02 MED ORDER — NAPROXEN 500 MG PO TABS: 500.0000 mg | ORAL_TABLET | Freq: Two times a day (BID) | ORAL | Status: DC

## 2016-09-02 MED ORDER — SULFAMETHOXAZOLE-TRIMETHOPRIM 800-160 MG PO TABS: 1.0000 | ORAL_TABLET | Freq: Two times a day (BID) | ORAL | 0 refills | Status: DC

## 2016-09-02 MED ORDER — TRAMADOL HCL 50 MG PO TABS
50.0000 mg | ORAL_TABLET | Freq: Once | ORAL | Status: AC
  Administered 2016-09-02: 50 mg via ORAL
  Filled 2016-09-02: qty 1

## 2016-09-02 MED ORDER — NEOMYCIN-POLYMYXIN-PRAMOXINE 1 % EX CREA: TOPICAL_CREAM | Freq: Two times a day (BID) | CUTANEOUS | 0 refills | Status: DC

## 2016-09-02 NOTE — ED Triage Notes (Addendum)
Pt c/o abrasion that is "in a line" going up inner left forearm, itching. Denies pain to arm. Rib pain only in AM upon awakening then goes away X 1 week. Bruise to upper right bicep, unsure of how he got it. Pt alert and oriented X4, active, cooperative, pt in NAD. RR even and unlabored, color WNL.

## 2016-09-02 NOTE — ED Notes (Signed)
Patient denies any  Current rib pain, reports rib pain only when he first wakes up. Pt currently c/o headache, abrasio and bruise

## 2016-09-02 NOTE — ED Notes (Signed)
Pt c/o right rib pain, bruise to right upper arm and abrasion to left arm X 1 week.
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
## 2016-09-02 NOTE — ED Notes (Signed)
Pt discharged home after verbalizing understanding of discharge instructions; nad noted. 

## 2016-09-02 NOTE — ED Provider Notes (Signed)
[facility] Emergency Department Provider Note   ____________________________________________   None    (approximate)  I have reviewed the triage vital signs and the nursing notes.   HISTORY  Chief Complaint Abrasion and Bleeding/Bruising    HPI Jackson Jimenez is a 25 y.o. male patient complain abrasion to the left forearm with a rate street traveling up the arm. Patient states he was helping his friend daughter move and push with thorns Embedded into his lower left forearm. Patient states since the incident. He stated intermittent itching and warmth to the area. Patient also states a bruise to the right upper arm and right rib pain but states cannot recall any provocative incident for this complaint. Patient's stay rib pain increase with palpation and deep inspirations. No palliative measures for each of these complaints. Patient rates his rib pain as a 5 over 5. Patient rates his left arm pain as a 2/5. No past medical history on file.  There are no active problems to display for this patient.   History reviewed. No pertinent surgical history.  Prior to Admission medications   Medication Sig Start Date End Date Taking? Authorizing Provider  diclofenac (VOLTAREN) 50 MG EC tablet Take 1 tablet (50 mg total) by mouth 2 (two) times daily. 06/09/16   Tommi Rumpshonda L Summers, PA-C  metoCLOPramide (REGLAN) 10 MG tablet Take 1 tablet (10 mg total) by mouth every 6 (six) hours as needed for nausea or vomiting. 07/14/15   Myrna Blazeravid Matthew Schaevitz, MD  naproxen (NAPROSYN) 500 MG tablet Take 1 tablet (500 mg total) by mouth 2 (two) times daily with a meal. 09/02/16   Joni Reiningonald K Lakena Sparlin, PA-C  neomycin-polymyxin-pramoxine (NEOSPORIN PLUS) 1 % cream Apply topically 2 (two) times daily. 09/02/16   Joni Reiningonald K Miquel Lamson, PA-C  sulfamethoxazole-trimethoprim (BACTRIM DS,SEPTRA DS) 800-160 MG tablet Take 1 tablet by mouth 2 (two) times daily. 09/02/16   Joni Reiningonald K Keyanni Whittinghill, PA-C  traMADol (ULTRAM)  50 MG tablet Take 1 tablet (50 mg total) by mouth every 6 (six) hours as needed for moderate pain. 09/02/16   Joni Reiningonald K Maxtyn Nuzum, PA-C    Allergies Albuterol  No family history on file.  Social History Social History  Substance Use Topics  . Smoking status: Former Smoker    Types: Cigarettes  . Smokeless tobacco: Not on file  . Alcohol use No    Review of Systems Constitutional: No fever/chills Eyes: No visual changes. ENT: No sore throat. Cardiovascular: Denies chest pain. Respiratory: Denies shortness of breath. Gastrointestinal: No abdominal pain.  No nausea, no vomiting.  No diarrhea.  No constipation. Genitourinary: Negative for dysuria. Musculoskeletal: Right renal pain  Skin: Negative for rash. Abrasion and redness to the forearm. Neurological: Negative for headaches, focal weakness or numbness. Allergic/Immunilogical: Albuterol _______________________________________   PHYSICAL EXAM:  VITAL SIGNS: ED Triage Vitals [09/02/16 1703]  Enc Vitals Group     BP (!) 141/77     Pulse Rate 71     Resp 16     Temp 98.4 F (36.9 C)     Temp Source Oral     SpO2 99 %     Weight 205 lb (93 kg)     Height 6' (1.829 m)     Head Circumference      Peak Flow      Pain Score      Pain Loc      Pain Edu?      Excl. in GC?     Constitutional: Alert  and oriented. Well appearing and in no acute distress. Eyes: Conjunctivae are normal. PERRL. EOMI. Head: Atraumatic. Nose: No congestion/rhinnorhea. Mouth/Throat: Mucous membranes are moist.  Oropharynx non-erythematous. Neck: No stridor.  No cervical spine tenderness to palpation. Hematological/Lymphatic/Immunilogical: No cervical lymphadenopathy. Cardiovascular: Normal rate, regular rhythm. Grossly normal heart sounds.  Good peripheral circulation. Respiratory: Normal respiratory effort.  No retractions. Lungs CTAB. Gastrointestinal: Soft and nontender. No distention. No abdominal bruits. No CVA  tenderness. Musculoskeletal:No obvious chest wall deformity. Equal S1 expansion. Patient has moderate guarding palpation of the right lateral thoracic area. Neurologic:  Normal speech and language. No gross focal neurologic deficits are appreciated. No gait instability. Skin:  Skin is warm, dry and intact. No rash noted. Ecchymosis lateral right arm. Abrasion was signs of excoriation to the left forearm. Psychiatric: Mood and affect are normal. Speech and behavior are normal.  ____________________________________________   LABS (all labs ordered are listed, but only abnormal results are displayed)  Labs Reviewed - No data to display ____________________________________________  EKG   ____________________________________________  RADIOLOGY  No acute findings x-ray of the right wrist. ____________________________________________   PROCEDURES  Procedure(s) performed: None  Procedures  Critical Care performed: No  ____________________________________________   INITIAL IMPRESSION / ASSESSMENT AND PLAN / ED COURSE  Pertinent labs & imaging results that were available during my care of the patient were reviewed by me and considered in my medical decision making (see chart for details).  Contusion to right upper arm and right lateral ribs. Skin infection left forearm. Patient given discharge care instructions. Patient given a prescription for Bactrim DS, naproxen, tramadol, and Neosporin. Ad[facility].  Clinical Course      ____________________________________________   FINAL CLINICAL IMPRESSION(S) / ED DIAGNOSES  Final diagnoses:  Staph skin infection  Rib contusion, right, initial encounter      NEW MEDICATIONS STARTED DURING THIS VISIT:  New Prescriptions   NAPROXEN (NAPROSYN) 500 MG TABLET    Take 1 tablet (500 mg total) by mouth 2 (two) times daily with a meal.   NEOMYCIN-POLYMYXIN-PRAMOXINE (NEOSPORIN  PLUS) 1 % CREAM    Apply topically 2 (two) times daily.   SULFAMETHOXAZOLE-TRIMETHOPRIM (BACTRIM DS,SEPTRA DS) 800-160 MG TABLET    Take 1 tablet by mouth 2 (two) times daily.   TRAMADOL (ULTRAM) 50 MG TABLET    Take 1 tablet (50 mg total) by mouth every 6 (six) hours as needed for moderate pain.     Note:  This document was prepared using Dragon voice recognition software and may include unintentional dictation errors.    Joni Reiningonald K Jaylyn Booher, PA-C 09/02/16 1807    Willy EddyPatrick Robinson, MD 09/02/16 [phone]

## 2016-09-06 ENCOUNTER — Emergency Department
Admission: EM | Admit: 2016-09-06 | Discharge: 2016-09-06 | Disposition: A | Discharge status: Home / Self Care | Payer: Self-pay | Attending: Emergency Medicine | Admitting: Emergency Medicine

## 2016-09-06 DIAGNOSIS — L2389 Allergic contact dermatitis due to other agents: Secondary | ICD-10-CM | POA: Insufficient documentation

## 2016-09-06 DIAGNOSIS — Z87891 Personal history of nicotine dependence: Secondary | ICD-10-CM | POA: Insufficient documentation

## 2016-09-06 DIAGNOSIS — Z79899 Other long term (current) drug therapy: Secondary | ICD-10-CM | POA: Insufficient documentation

## 2016-09-06 MED ORDER — DIPHENHYDRAMINE HCL 25 MG PO CAPS: 25.0000 mg | ORAL_CAPSULE | ORAL | 2 refills | Status: DC | PRN

## 2016-09-06 MED ORDER — RANITIDINE HCL 300 MG PO TABS: 300.0000 mg | ORAL_TABLET | Freq: Every day | ORAL | 1 refills | Status: DC

## 2016-09-06 MED ORDER — CEPHALEXIN 500 MG PO CAPS: 500.0000 mg | ORAL_CAPSULE | Freq: Two times a day (BID) | ORAL | 0 refills | Status: AC

## 2016-09-06 MED ORDER — HYDROCORTISONE 2 % EX LOTN: 1.0000 "application " | TOPICAL_LOTION | Freq: Two times a day (BID) | CUTANEOUS | 0 refills | Status: DC

## 2016-09-06 NOTE — ED Notes (Signed)
Pt was seen a few days ago for rash/scratch to left arm, pt reports arm is worse today , with more redness

## 2016-09-06 NOTE — ED Triage Notes (Signed)
Pt here with continued rash, redness and swelling to left arm. Pt was recently tx for "staff infection".

## 2016-09-06 NOTE — ED Provider Notes (Signed)
[facility] Emergency Department Provider Note  ____________________________________________  Time seen: Approximately 6:10 PM  I have reviewed the triage vital signs and the nursing notes.   HISTORY  Chief Complaint Rash   HPI Jackson Jimenez is a 25 y.o. male presenting to the emergency department with left volar forearm rash. Patient was seen on 09/02/2016 after localized erythema occurred from scratching a wound from a "bur bush". Patient states that he was treated with Neosporin, Bactrim and naproxen. Patient states that he has been taking his antibiotic as directed. However, today when he was changing his bandage he noticed erythema and "bumps" localized above scratch sites. Patient became concerned. He denies fever or changes in energy. Patient has been applying Neosporin as directed.  No past medical history on file.  There are no active problems to display for this patient.   No past surgical history on file.  Prior to Admission medications   Medication Sig Start Date End Date Taking? Authorizing Provider  cephALEXin (KEFLEX) 500 MG capsule Take 1 capsule (500 mg total) by mouth 2 (two) times daily. 09/06/16 09/16/16  Orvil FeilJaclyn M Gaetano Romberger, PA-C  diclofenac (VOLTAREN) 50 MG EC tablet Take 1 tablet (50 mg total) by mouth 2 (two) times daily. 06/09/16   Tommi Rumpshonda L Summers, PA-C  diphenhydrAMINE (BENADRYL) 25 mg capsule Take 1 capsule (25 mg total) by mouth every 4 (four) hours as needed. 09/06/16 09/16/16  Dayna BarkerJaclyn M Timira Bieda, PA-C  HYDROCORTISONE, TOPICAL, 2 % LOTN Apply 1 application topically 2 (two) times daily. 09/06/16   Orvil FeilJaclyn M Ariahna Smiddy, PA-C  metoCLOPramide (REGLAN) 10 MG tablet Take 1 tablet (10 mg total) by mouth every 6 (six) hours as needed for nausea or vomiting. 07/14/15   Myrna Blazeravid Matthew Schaevitz, MD  naproxen (NAPROSYN) 500 MG tablet Take 1 tablet (500 mg total) by mouth 2 (two) times daily with a meal. 09/02/16   Joni Reiningonald K Smith, PA-C   neomycin-polymyxin-pramoxine (NEOSPORIN PLUS) 1 % cream Apply topically 2 (two) times daily. 09/02/16   Joni Reiningonald K Smith, PA-C  ranitidine (ZANTAC) 300 MG tablet Take 1 tablet (300 mg total) by mouth at bedtime. 09/06/16 09/06/17  Orvil FeilJaclyn M Terica Yogi, PA-C  sulfamethoxazole-trimethoprim (BACTRIM DS,SEPTRA DS) 800-160 MG tablet Take 1 tablet by mouth 2 (two) times daily. 09/02/16   Joni Reiningonald K Smith, PA-C  traMADol (ULTRAM) 50 MG tablet Take 1 tablet (50 mg total) by mouth every 6 (six) hours as needed for moderate pain. 09/02/16   Joni Reiningonald K Smith, PA-C    Allergies Albuterol  No family history on file.  Social History Social History  Substance Use Topics  . Smoking status: Former Smoker    Types: Cigarettes  . Smokeless tobacco: Not on file  . Alcohol use No    Review of Systems  Constitutional: Negative for fever/chills Respiratory: Negative for shortness of breath. Musculoskeletal: Negative  for pain. Skin: Patient has rash.  Neurological: Negative for headaches, focal weakness or numbness. ____________________________________________   PHYSICAL EXAM:  VITAL SIGNS: ED Triage Vitals  Enc Vitals Group     BP 09/06/16 1547 119/72     Pulse Rate 09/06/16 1547 67     Resp 09/06/16 1547 18     Temp 09/06/16 1547 98.2 F (36.8 C)     Temp Source 09/06/16 1547 Oral     SpO2 09/06/16 1547 100 %     Weight 09/06/16 1548 200 lb (90.7 kg)     Height 09/06/16 1548 6' (1.829 m)     Head  Circumference --      Peak Flow --      Pain Score 09/06/16 1554 5     Pain Loc --      Pain Edu? --      Excl. in GC? --      Constitutional: Alert and oriented. Well appearing and in no acute distress. Cardiovascular: Good peripheral circulation. No murmurs, gallops or rubs. Respiratory: Normal respiratory effort.  No retractions. Lungs CTAB. Musculoskeletal: FROM throughout. Neurologic:  Normal speech and language. No gross focal neurologic deficits are appreciated. Skin:  Patient has a linear  scratch along the volar aspect of his left forearm. There is mild erythema with macular rash above scratch. No vesicular formation. No streaking.  ____________________________________________   LABS (all labs ordered are listed, but only abnormal results are displayed)  Labs Reviewed - No data to display ____________________________________________  EKG   ____________________________________________  RADIOLOGY   ____________________________________________   PROCEDURES  Procedure(s) performed:  ____________________________________________   INITIAL IMPRESSION / ASSESSMENT AND PLAN / ED COURSE  Clinical Course     Pertinent labs & imaging results that were available during my care of the patient were reviewed by me and considered in my medical decision making (see chart for details).  Assessment and plan: Patient has mild erythema and a macular rash above a volar forearm scratch to the left upper extremity. Findings are consistent with neomycin dermatitis. Patient was discharged with Benadryl, ranitidine, hydrocortisone 2%, and Keflex. I added Keflex to patient's antibiotic regimen to cover for MSSA. Vital signs are reassuring at this time. All patient questions were answered.  _ ___________________________________________   FINAL CLINICAL IMPRESSION(S) / ED DIAGNOSES  Final diagnoses:  Allergic contact dermatitis due to other agents    Discharge Medication List as of 09/06/2016  4:50 PM    START taking these medications   Details  cephALEXin (KEFLEX) 500 MG capsule Take 1 capsule (500 mg total) by mouth 2 (two) times daily., Starting Tue 09/06/2016, Until Fri 09/16/2016, Print    diphenhydrAMINE (BENADRYL) 25 mg capsule Take 1 capsule (25 mg total) by mouth every 4 (four) hours as needed., Starting Tue 09/06/2016, Until Fri 09/16/2016, Print    HYDROCORTISONE, TOPICAL, 2 % LOTN Apply 1 application topically 2 (two) times daily., Starting Tue 09/06/2016, Print     ranitidine (ZANTAC) 300 MG tablet Take 1 tablet (300 mg total) by mouth at bedtime., Starting Tue 09/06/2016, Until Wed 09/06/2017, Print        Note:  This document was prepared using Dragon voice recognition software and may include unintentional dictation errors.    Orvil FeilJaclyn M Rozella Servello, PA-C 09/06/16 1825    Arnaldo NatalPaul F Malinda, MD 09/07/16 [phone]

## 2017-04-21 ENCOUNTER — Emergency Department: Payer: Self-pay

## 2017-04-21 ENCOUNTER — Emergency Department
Admission: EM | Admit: 2017-04-21 | Discharge: 2017-04-21 | Disposition: A | Discharge status: Home / Self Care | Payer: Self-pay | Attending: Emergency Medicine | Admitting: Emergency Medicine

## 2017-04-21 ENCOUNTER — Encounter: Payer: Self-pay | Admitting: Emergency Medicine

## 2017-04-21 DIAGNOSIS — S60221A Contusion of right hand, initial encounter: Secondary | ICD-10-CM | POA: Insufficient documentation

## 2017-04-21 DIAGNOSIS — X58XXXA Exposure to other specified factors, initial encounter: Secondary | ICD-10-CM | POA: Insufficient documentation

## 2017-04-21 DIAGNOSIS — Y999 Unspecified external cause status: Secondary | ICD-10-CM | POA: Insufficient documentation

## 2017-04-21 DIAGNOSIS — Y929 Unspecified place or not applicable: Secondary | ICD-10-CM | POA: Insufficient documentation

## 2017-04-21 DIAGNOSIS — Z79899 Other long term (current) drug therapy: Secondary | ICD-10-CM | POA: Insufficient documentation

## 2017-04-21 DIAGNOSIS — Y939 Activity, unspecified: Secondary | ICD-10-CM | POA: Insufficient documentation

## 2017-04-21 DIAGNOSIS — F17210 Nicotine dependence, cigarettes, uncomplicated: Secondary | ICD-10-CM | POA: Insufficient documentation

## 2017-04-21 MED ORDER — KETOROLAC TROMETHAMINE 30 MG/ML IJ SOLN
30.0000 mg | Freq: Once | INTRAMUSCULAR | Status: AC
Start: 2017-04-21 — End: 2017-04-21
  Administered 2017-04-21: 30 mg via INTRAMUSCULAR
  Filled 2017-04-21: qty 1

## 2017-04-21 MED ORDER — MELOXICAM 15 MG PO TABS: 15.0000 mg | ORAL_TABLET | Freq: Every day | ORAL | 0 refills | Status: DC

## 2017-04-21 NOTE — ED Triage Notes (Signed)
First Nurse Note:  C/O pain and swelling to right knuckles.

## 2017-04-21 NOTE — ED Triage Notes (Signed)
Pt with right hand pain and swelling for over one week. Pt with cms intact to all right fingers, pt unsure if he injured or not. Pt lays carpet for a living.

## 2017-04-21 NOTE — ED Provider Notes (Signed)
[facility] Emergency Department Provider Note  ____________________________________________  Time seen: Approximately 10:15 PM  I have reviewed the triage vital signs and the nursing notes.   HISTORY  Chief Complaint Hand Pain    HPI Jackson Jimenez is a 26 y.o. male who presents emergency department complaining of right hand pain. Patient is experiencing pain to the third and fourthjoints. Patient reports that 4 days ago he was drinking with body and "blacked out. When he woke up he had a bandage over his right hand with no visible skin wound. Patient is unsure what happened to his hand. For the past several days he has had increasing pain to the right hand. Full range of motion all digits. Full range of motion to the wrist. No other injury or complaint. No medications prior to arrival. No other known injury. No other complaints at this time.   History reviewed. No pertinent past medical history.  There are no active problems to display for this patient.   History reviewed. No pertinent surgical history.  Prior to Admission medications   Medication Sig Start Date End Date Taking? Authorizing Provider  diclofenac (VOLTAREN) 50 MG EC tablet Take 1 tablet (50 mg total) by mouth 2 (two) times daily. 06/09/16   Tommi Rumps, PA-C  diphenhydrAMINE (BENADRYL) 25 mg capsule Take 1 capsule (25 mg total) by mouth every 4 (four) hours as needed. 09/06/16 09/16/16  Pia Mau M, PA-C  HYDROCORTISONE, TOPICAL, 2 % LOTN Apply 1 application topically 2 (two) times daily. 09/06/16   Orvil Feil, PA-C  meloxicam (MOBIC) 15 MG tablet Take 1 tablet (15 mg total) by mouth daily. 04/21/17   Cuthriell, Delorise Royals, PA-C  metoCLOPramide (REGLAN) 10 MG tablet Take 1 tablet (10 mg total) by mouth every 6 (six) hours as needed for nausea or vomiting. 07/14/15   Myrna Blazer, MD  naproxen (NAPROSYN) 500 MG tablet Take 1 tablet (500 mg total) by mouth 2 (two) times  daily with a meal. 09/02/16   Joni Reining, PA-C  neomycin-polymyxin-pramoxine (NEOSPORIN PLUS) 1 % cream Apply topically 2 (two) times daily. 09/02/16   Joni Reining, PA-C  ranitidine (ZANTAC) 300 MG tablet Take 1 tablet (300 mg total) by mouth at bedtime. 09/06/16 09/06/17  Orvil Feil, PA-C  sulfamethoxazole-trimethoprim (BACTRIM DS,SEPTRA DS) 800-160 MG tablet Take 1 tablet by mouth 2 (two) times daily. 09/02/16   Joni Reining, PA-C  traMADol (ULTRAM) 50 MG tablet Take 1 tablet (50 mg total) by mouth every 6 (six) hours as needed for moderate pain. 09/02/16   Joni Reining, PA-C    Allergies Albuterol  History reviewed. No pertinent family history.  Social History Social History  Substance Use Topics  . Smoking status: Current Some Day Smoker    Types: Cigarettes  . Smokeless tobacco: Never Used  . Alcohol use No     Review of Systems  Constitutional: No fever/chills Cardiovascular: no chest pain. Respiratory: no cough. No SOB. Musculoskeletal: Positive for right hand pain Skin: Negative for rash, abrasions, lacerations, ecchymosis. Neurological: Negative for headaches, focal weakness or numbness. 10-point ROS otherwise negative.  ____________________________________________   PHYSICAL EXAM:  VITAL SIGNS: ED Triage Vitals [04/21/17 1951]  Enc Vitals Group     BP (!) 144/76     Pulse Rate 70     Resp 16     Temp 98.8 F (37.1 C)     Temp Source Oral     SpO2 100 %  Weight 195 lb (88.5 kg)     Height 5\' 9"  (1.753 m)     Head Circumference      Peak Flow      Pain Score 8     Pain Loc      Pain Edu?      Excl. in GC?      Constitutional: Alert and oriented. Well appearing and in no acute distress. Eyes: Conjunctivae are normal. PERRL. EOMI. Head: Atraumatic. Neck: No stridor.    Cardiovascular: Normal rate, regular rhythm. Normal S1 and S2.  Good peripheral circulation. Respiratory: Normal respiratory effort without tachypnea or  retractions. Lungs CTAB. Good air entry to the bases with no decreased or absent breath sounds. Musculoskeletal: Full range of motion to all extremities. No gross deformities appreciated.No gross deformity or edema noted to right hand upon insertion. No ecchymosis. Patient is tender to palpation over the distal third and fourth metacarpal bone. No palpable abnormality. Full range of motion all 5 digits. Sensation and cap refill intact all 5 digits. Examination of the wrist is unremarkable. Neurologic:  Normal speech and language. No gross focal neurologic deficits are appreciated.  Skin:  Skin is warm, dry and intact. No rash noted. Psychiatric: Mood and affect are normal. Speech and behavior are normal. Patient exhibits appropriate insight and judgement.   ____________________________________________   LABS (all labs ordered are listed, but only abnormal results are displayed)  Labs Reviewed - No data to display ____________________________________________  EKG   ____________________________________________  RADIOLOGY Festus Barren Cuthriell, personally viewed and evaluated these images (plain radiographs) as part of my medical decision making, as well as reviewing the written report by the radiologist.  Dg Hand Complete Right  Result Date: 04/21/2017 CLINICAL DATA:  Right hand pain and swelling. EXAM: RIGHT HAND - COMPLETE 3+ VIEW COMPARISON:  None. FINDINGS: There is no evidence of fracture or dislocation. There is no evidence of arthropathy or other focal bone abnormality. Soft tissues are unremarkable. IMPRESSION: Negative. Electronically Signed   By: Irish Lack M.D.   On: 04/21/2017 20:29    ____________________________________________    PROCEDURES  Procedure(s) performed:    Procedures    Medications  ketorolac (TORADOL) 30 MG/ML injection 30 mg (30 mg Intramuscular Given 04/21/17 2232)     ____________________________________________   INITIAL IMPRESSION  / ASSESSMENT AND PLAN / ED COURSE  Pertinent labs & imaging results that were available during my care of the patient were reviewed by me and considered in my medical decision making (see chart for details).  [place] of [place] was performed in accordance of the [facility] prior to dispensing any controlled drugs.     Patient's diagnosis is consistent with right hand contusion. Patient suffered an unknown injury 4 days ago on intoxicated. Patient is unsure atrophic injury. Since that he has had increasing hand pain. X-ray reveals no acute osseous abnormality. Exam is reassuring. No signs of infection. At this time, the diagnosis is consistent with contusion of the right hand. Patient is given a shot of Toradol emergency department.. Patient will be discharged home with prescriptions for meloxicam for symptom control. Patient is to follow up with orthopedics as needed or otherwise directed. Patient is given ED precautions to return to the ED for any worsening or new symptoms.     ____________________________________________  FINAL CLINICAL IMPRESSION(S) / ED DIAGNOSES  Final diagnoses:  Contusion of right hand, initial encounter      NEW MEDICATIONS STARTED DURING THIS VISIT:  New Prescriptions  MELOXICAM (MOBIC) 15 MG TABLET    Take 1 tablet (15 mg total) by mouth daily.        This chart was dictated using voice recognition software/Dragon. Despite best efforts to proofread, errors can occur which can change the meaning. Any change was purely unintentional.    Racheal PatchesCuthriell, Jonathan D, PA-C 04/21/17 2240    Myrna BlazerSchaevitz, David Matthew, MD 04/21/17 2352

## 2017-04-21 NOTE — ED Notes (Signed)
Pt. Going by self.

## 2017-09-19 ENCOUNTER — Other Ambulatory Visit: Payer: Self-pay

## 2017-09-19 ENCOUNTER — Emergency Department
Admission: EM | Admit: 2017-09-19 | Discharge: 2017-09-19 | Disposition: A | Discharge status: Home / Self Care | Payer: BLUE CROSS/BLUE SHIELD | Attending: Emergency Medicine | Admitting: Emergency Medicine

## 2017-09-19 ENCOUNTER — Emergency Department: Payer: BLUE CROSS/BLUE SHIELD

## 2017-09-19 DIAGNOSIS — F17210 Nicotine dependence, cigarettes, uncomplicated: Secondary | ICD-10-CM | POA: Diagnosis not present

## 2017-09-19 DIAGNOSIS — R079 Chest pain, unspecified: Secondary | ICD-10-CM | POA: Diagnosis present

## 2017-09-19 DIAGNOSIS — R0602 Shortness of breath: Secondary | ICD-10-CM | POA: Diagnosis not present

## 2017-09-19 DIAGNOSIS — J060 Acute laryngopharyngitis: Secondary | ICD-10-CM

## 2017-09-19 DIAGNOSIS — J029 Acute pharyngitis, unspecified: Secondary | ICD-10-CM | POA: Insufficient documentation

## 2017-09-19 MED ORDER — GI COCKTAIL ~~LOC~~
30.0000 mL | ORAL | Status: AC
  Administered 2017-09-19: 30 mL via ORAL
  Filled 2017-09-19: qty 30

## 2017-09-19 MED ORDER — PREDNISONE 20 MG PO TABS: 40.0000 mg | ORAL_TABLET | Freq: Every day | ORAL | 0 refills | Status: DC

## 2017-09-19 MED ORDER — NAPROXEN SODIUM 275 MG PO TABS: 550.0000 mg | ORAL_TABLET | Freq: Two times a day (BID) | ORAL | 0 refills | Status: DC

## 2017-09-19 MED ORDER — FAMOTIDINE 20 MG PO TABS: 20.0000 mg | ORAL_TABLET | Freq: Two times a day (BID) | ORAL | 0 refills | Status: DC

## 2017-09-19 MED ORDER — KETOROLAC TROMETHAMINE 60 MG/2ML IM SOLN
15.0000 mg | Freq: Once | INTRAMUSCULAR | Status: AC
  Administered 2017-09-19: 15 mg via INTRAMUSCULAR
  Filled 2017-09-19: qty 2

## 2017-09-19 MED ORDER — IPRATROPIUM-ALBUTEROL 0.5-2.5 (3) MG/3ML IN SOLN
3.0000 mL | Freq: Once | RESPIRATORY_TRACT | Status: AC
  Administered 2017-09-19: 3 mL via RESPIRATORY_TRACT
  Filled 2017-09-19: qty 3

## 2017-09-19 MED ORDER — DEXAMETHASONE SODIUM PHOSPHATE 10 MG/ML IJ SOLN
10.0000 mg | Freq: Once | INTRAMUSCULAR | Status: AC
  Administered 2017-09-19: 10 mg via INTRAMUSCULAR
  Filled 2017-09-19: qty 1

## 2017-09-19 NOTE — ED Provider Notes (Signed)
[facility] Emergency Department Provider Note  ____________________________________________  Time seen: Approximately 9:24 AM  I have reviewed the triage vital signs and the nursing notes.   HISTORY  Chief Complaint Chest Pain and Shortness of Breath    HPI Jackson Jimenez is a 27 y.o. male reports waking up this morning at about 7:00 feeling like it was hard to breathe. Denies pain or cough. No fevers chills or sweats. He does have a sore throat. Symptoms are constant and severe. No identified aggravating or alleviating factors.   He was in his usual state of health yesterday. 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 He does not use any kind of facemask and inhales a lot of dust. He has a history of breathing problems but avoids albuterol because he feels like he had an adverse reaction to her once causing a rash. Many other times he has been able to use it without any difficulty. No exposure to any known allergens so far. Doesn't take any medicines.     History reviewed. No pertinent past medical history.   There are no active problems to display for this patient.    History reviewed. No pertinent surgical history.   Prior to Admission medications   Medication Sig Start Date End Date Taking? Authorizing Provider  diclofenac (VOLTAREN) 50 MG EC tablet Take 1 tablet (50 mg total) by mouth 2 (two) times daily. Patient not taking: Reported on 09/19/2017 06/09/16   Tommi RumpsSummers, Rhonda L, PA-C  diphenhydrAMINE (BENADRYL) 25 mg capsule Take 1 capsule (25 mg total) by mouth every 4 (four) hours as needed. 09/06/16 09/16/16  Orvil FeilWoods, Jaclyn M, PA-C  famotidine (PEPCID) 20 MG tablet Take 1 tablet (20 mg total) by mouth 2 (two) times daily. 09/19/17   Sharman CheekStafford, Datron Brakebill, MD  HYDROCORTISONE, TOPICAL, 2 % LOTN Apply 1 application topically 2 (two) times daily. Patient not taking: Reported on 09/19/2017 09/06/16   Orvil FeilWoods,  Jaclyn M, PA-C  meloxicam (MOBIC) 15 MG tablet Take 1 tablet (15 mg total) by mouth daily. Patient not taking: Reported on 09/19/2017 04/21/17   Cuthriell, Delorise RoyalsJonathan D, PA-C  metoCLOPramide (REGLAN) 10 MG tablet Take 1 tablet (10 mg total) by mouth every 6 (six) hours as needed for nausea or vomiting. Patient not taking: Reported on 09/19/2017 07/14/15   Myrna BlazerSchaevitz, David Matthew, MD  naproxen (NAPROSYN) 500 MG tablet Take 1 tablet (500 mg total) by mouth 2 (two) times daily with a meal. Patient not taking: Reported on 09/19/2017 09/02/16   Joni ReiningSmith, Ronald K, PA-C  naproxen sodium (ANAPROX) 275 MG tablet Take 2 tablets (550 mg total) by mouth 2 (two) times daily with a meal. 09/19/17   Sharman CheekStafford, Jovin Fester, MD  neomycin-polymyxin-pramoxine (NEOSPORIN PLUS) 1 % cream Apply topically 2 (two) times daily. Patient not taking: Reported on 09/19/2017 09/02/16   Joni ReiningSmith, Ronald K, PA-C  predniSONE (DELTASONE) 20 MG tablet Take 2 tablets (40 mg total) by mouth daily. 09/19/17   Sharman CheekStafford, Shanique Aslinger, MD  ranitidine (ZANTAC) 300 MG tablet Take 1 tablet (300 mg total) by mouth at bedtime. 09/06/16 09/06/17  Orvil FeilWoods, Jaclyn M, PA-C  sulfamethoxazole-trimethoprim (BACTRIM DS,SEPTRA DS) 800-160 MG tablet Take 1 tablet by mouth 2 (two) times daily. Patient not taking: Reported on 09/19/2017 09/02/16   Joni ReiningSmith, Ronald K, PA-C  traMADol (ULTRAM) 50 MG tablet Take 1 tablet (50 mg total) by mouth every 6 (six) hours as needed for moderate pain. Patient not taking: Reported  on 09/19/2017 09/02/16   Joni Reining, PA-C     Allergies Albuterol   No family history on file.  Social History Social History   Tobacco Use  . Smoking status: Current Some Day Smoker    Types: Cigarettes  . Smokeless tobacco: Never Used  Substance Use Topics  . Alcohol use: No  . Drug use: No    Review of Systems  Constitutional:   No fever or chills.  ENT:   Positive sore throat. No rhinorrhea. Cardiovascular:   No chest pain or syncope. Respiratory:    Positive shortness of breath without cough. Gastrointestinal:   Negative for abdominal pain, vomiting and diarrhea.  Musculoskeletal:   Negative for focal pain or swelling All other systems reviewed and are negative except as documented above in ROS and HPI.  ____________________________________________   PHYSICAL EXAM:  VITAL SIGNS: ED Triage Vitals  Enc Vitals Group     BP 09/19/17 0822 134/77     Pulse Rate 09/19/17 0822 79     Resp 09/19/17 0822 (!) 26     Temp --      Temp src --      SpO2 09/19/17 0822 100 %     Weight 09/19/17 0823 195 lb (88.5 kg)     Height --      Head Circumference --      Peak Flow --      Pain Score 09/19/17 0822 7     Pain Loc --      Pain Edu? --      Excl. in GC? --     Vital signs reviewed, nursing assessments reviewed.   Constitutional:   Alert and oriented. Well appearing and in no distress. Anxious appearing Eyes:   No scleral icterus.  EOMI. No nystagmus. No conjunctival pallor. PERRL. ENT   Head:   Normocephalic and atraumatic.   Nose:   No congestion/rhinnorhea.    Mouth/Throat:   MMM, diffuse pharyngeal erythema. No peritonsillar mass.    Neck:   No meningismus. Full ROM. No crepitus Hematological/Lymphatic/Immunilogical:   No cervical lymphadenopathy. Cardiovascular:   RRR. Symmetric bilateral radial and DP pulses.  No murmurs.  Respiratory:   Normal respiratory effort without tachypnea/retractions. Breath sounds are clear and equal bilaterally. No wheezes/rales/rhonchi. There are upper airway sounds with inspiration, but not stridorous. Gastrointestinal:   Soft and nontender. Non distended. There is no CVA tenderness.  No rebound, rigidity, or guarding. Genitourinary:   deferred Musculoskeletal:   Normal range of motion in all extremities. No joint effusions.  No lower extremity tenderness.  No edema. Neurologic:   Normal speech and language.  Motor grossly intact. No gross focal neurologic deficits are  appreciated.  Skin:    Skin is warm, dry and intact. No rash noted.  No petechiae, purpura, or bullae.  ____________________________________________    LABS (pertinent positives/negatives) (all labs ordered are listed, but only abnormal results are displayed) Labs Reviewed - No data to display ____________________________________________   EKG  Interpreted by me  Date: 09/19/2017  Rate: 81  Rhythm: normal sinus rhythm  QRS Axis: normal  Intervals: normal  ST/T Wave abnormalities: normal  Conduction Disutrbances: none  Narrative Interpretation: unremarkable      ____________________________________________    RADIOLOGY  Dg Chest 2 View  Result Date: 09/19/2017 CLINICAL DATA:  Chest pain and shortness of breath EXAM: CHEST  2 VIEW COMPARISON:  September 02, 2016 FINDINGS: There is no edema or consolidation. The heart size and pulmonary vascularity  are normal. No adenopathy. No bone lesions. No pneumothorax. IMPRESSION: No edema or consolidation. Electronically Signed   By: Bretta Bang III M.D.   On: 09/19/2017 08:55    ____________________________________________   PROCEDURES Procedures  ____________________________________________    CLINICAL IMPRESSION / ASSESSMENT AND PLAN / ED COURSE  Pertinent labs & imaging results that were available during my care of the patient were reviewed by me and considered in my medical decision making (see chart for details).     Clinical Course as of Sep 20 947  Tue Sep 19, 2017  0912 Well appearing, nad. Upper airway sounds without distress. No PTA/RPA. Not stridorous. Appears to be uncomplicated pharyngitis. Will tx symptoms. Pt reports albuterol allergy is rare and usually he tolerates it fine, and he wishes to have albuterol today.   [PS]  0945 Feels better, ready to be discharged. Suitable for outpatient follow up.   [PS]    Clinical Course User Index [PS] Sharman Cheek, MD      ----------------------------------------- 9:49 AM on 09/19/2017 -----------------------------------------  Considering the patient's symptoms, medical history, and physical examination today, I have low suspicion for ACS, PE, TAD, pneumothorax, carditis, mediastinitis, pneumonia, CHF, or sepsis. Low suspicion of PTA RPA or other neck or soft tissue infection.    ____________________________________________   FINAL CLINICAL IMPRESSION(S) / ED DIAGNOSES    Final diagnoses:  Shortness of breath  Sore throat and laryngitis       Portions of this note were generated with dragon dictation software. Dictation errors may occur despite best attempts at proofreading.    Sharman Cheek, MD 09/19/17 [phone]

## 2017-09-19 NOTE — ED Notes (Signed)
Pt states his face and arms and tingling. Instructed that patient needs to slow breathing. Pt ambulated from wheelchair to stretcher without difficulty. Changing into gown. Sister at bedside. Vernona RiegerLaura, RN aware that patient in room.

## 2017-09-19 NOTE — ED Notes (Signed)
Patient ambulatory to lobby with NAD noted. Verbalized understanding of discharge instructions, prescriptions and follow-up care.

## 2017-09-19 NOTE — ED Triage Notes (Signed)
Pt reporting CP to center and SOB that started at 7AM today. Pt hyperventilating, dry heaving, unable to sit still and answer questions.

## 2018-01-20 ENCOUNTER — Emergency Department
Admission: EM | Admit: 2018-01-20 | Discharge: 2018-01-20 | Disposition: A | Discharge status: Home / Self Care | Payer: BLUE CROSS/BLUE SHIELD | Attending: Emergency Medicine | Admitting: Emergency Medicine

## 2018-01-20 ENCOUNTER — Other Ambulatory Visit: Payer: Self-pay

## 2018-01-20 ENCOUNTER — Emergency Department: Payer: BLUE CROSS/BLUE SHIELD

## 2018-01-20 DIAGNOSIS — F17210 Nicotine dependence, cigarettes, uncomplicated: Secondary | ICD-10-CM | POA: Diagnosis not present

## 2018-01-20 DIAGNOSIS — R0789 Other chest pain: Secondary | ICD-10-CM | POA: Diagnosis present

## 2018-01-20 DIAGNOSIS — M940 Chondrocostal junction syndrome [Tietze]: Secondary | ICD-10-CM | POA: Diagnosis not present

## 2018-01-20 DIAGNOSIS — Z79899 Other long term (current) drug therapy: Secondary | ICD-10-CM | POA: Diagnosis not present

## 2018-01-20 MED ORDER — DEXAMETHASONE SODIUM PHOSPHATE 10 MG/ML IJ SOLN
10.0000 mg | Freq: Once | INTRAMUSCULAR | Status: AC
  Administered 2018-01-20: 10 mg via INTRAMUSCULAR
  Filled 2018-01-20: qty 1

## 2018-01-20 MED ORDER — PREDNISONE 10 MG PO TABS: 10.0000 mg | ORAL_TABLET | Freq: Every day | ORAL | 0 refills | Status: DC

## 2018-01-20 MED ORDER — TRAMADOL HCL 50 MG PO TABS: 50.0000 mg | ORAL_TABLET | Freq: Four times a day (QID) | ORAL | 0 refills | Status: DC | PRN

## 2018-01-20 MED ORDER — HYDROCODONE-ACETAMINOPHEN 5-325 MG PO TABS
1.0000 | ORAL_TABLET | Freq: Once | ORAL | Status: AC
  Administered 2018-01-20: 1 via ORAL
  Filled 2018-01-20: qty 1

## 2018-01-20 NOTE — ED Provider Notes (Signed)
[facility] Emergency Department Provider Note  ____________________________________________  Time seen: Approximately 6:00 PM  I have reviewed the triage vital signs and the nursing notes.   HISTORY  Chief Complaint Chest Pain    HPI Jackson Jimenez is a 27 y.o. male who presents the emergency department complaining of right rib pain times several weeks.  Patient denies any precipitating trauma.  Symptoms have been worsening.  Patient reports that an aching and sharp sensation.  Patient reports that he does have some coughing at nighttime but none through the day.  Patient denies any allergic rhinitis symptoms.  No fevers or chills, nausea vomiting, abdominal pain, diarrhea or constipation.  Patient denies any substernal chest pain.  Patient states that the pain is reproducible with palpation.  No past medical history on file.  There are no active problems to display for this patient.   No past surgical history on file.  Prior to Admission medications   Medication Sig Start Date End Date Taking? Authorizing Provider  diclofenac (VOLTAREN) 50 MG EC tablet Take 1 tablet (50 mg total) by mouth 2 (two) times daily. Patient not taking: Reported on 09/19/2017 06/09/16   Tommi Rumps, PA-C  diphenhydrAMINE (BENADRYL) 25 mg capsule Take 1 capsule (25 mg total) by mouth every 4 (four) hours as needed. 09/06/16 09/16/16  Orvil Feil, PA-C  famotidine (PEPCID) 20 MG tablet Take 1 tablet (20 mg total) by mouth 2 (two) times daily. 09/19/17   Sharman Cheek, MD  HYDROCORTISONE, TOPICAL, 2 % LOTN Apply 1 application topically 2 (two) times daily. Patient not taking: Reported on 09/19/2017 09/06/16   Orvil Feil, PA-C  meloxicam (MOBIC) 15 MG tablet Take 1 tablet (15 mg total) by mouth daily. Patient not taking: Reported on 09/19/2017 04/21/17   Trannie Bardales, Delorise Royals, PA-C  metoCLOPramide (REGLAN) 10 MG tablet Take 1 tablet (10 mg total) by mouth every 6 (six) hours  as needed for nausea or vomiting. Patient not taking: Reported on 09/19/2017 07/14/15   Myrna Blazer, MD  naproxen (NAPROSYN) 500 MG tablet Take 1 tablet (500 mg total) by mouth 2 (two) times daily with a meal. Patient not taking: Reported on 09/19/2017 09/02/16   Joni Reining, PA-C  naproxen sodium (ANAPROX) 275 MG tablet Take 2 tablets (550 mg total) by mouth 2 (two) times daily with a meal. 09/19/17   Sharman Cheek, MD  neomycin-polymyxin-pramoxine (NEOSPORIN PLUS) 1 % cream Apply topically 2 (two) times daily. Patient not taking: Reported on 09/19/2017 09/02/16   Joni Reining, PA-C  predniSONE (DELTASONE) 10 MG tablet Take 1 tablet (10 mg total) by mouth daily. 01/20/18   England Greb, Delorise Royals, PA-C  ranitidine (ZANTAC) 300 MG tablet Take 1 tablet (300 mg total) by mouth at bedtime. 09/06/16 09/06/17  Orvil Feil, PA-C  sulfamethoxazole-trimethoprim (BACTRIM DS,SEPTRA DS) 800-160 MG tablet Take 1 tablet by mouth 2 (two) times daily. Patient not taking: Reported on 09/19/2017 09/02/16   Joni Reining, PA-C  traMADol (ULTRAM) 50 MG tablet Take 1 tablet (50 mg total) by mouth every 6 (six) hours as needed. 01/20/18   Stepan Verrette, Delorise Royals, PA-C    Allergies Albuterol  No family history on file.  Social History Social History   Tobacco Use  . Smoking status: Current Some Day Smoker    Types: Cigarettes  . Smokeless tobacco: Never Used  Substance Use Topics  . Alcohol use: No  . Drug use: No     Review of  Systems  Constitutional: No fever/chills Eyes: No visual changes. No discharge ENT: No upper respiratory complaints. Cardiovascular: no substernal chest pain. Respiratory: no cough. No SOB. Gastrointestinal: No abdominal pain.  No nausea, no vomiting.  No diarrhea.  No constipation. Musculoskeletal: Positive for right rib pain Skin: Negative for rash, abrasions, lacerations, ecchymosis. Neurological: Negative for headaches, focal weakness or numbness. 10-point  ROS otherwise negative.  ____________________________________________   PHYSICAL EXAM:  VITAL SIGNS: ED Triage Vitals  Enc Vitals Group     BP 01/20/18 1735 139/80     Pulse Rate 01/20/18 1735 69     Resp 01/20/18 1735 18     Temp 01/20/18 1735 98.3 F (36.8 C)     Temp Source 01/20/18 1735 Oral     SpO2 01/20/18 1735 97 %     Weight 01/20/18 1735 220 lb (99.8 kg)     Height 01/20/18 1735 6' (1.829 m)     Head Circumference --      Peak Flow --      Pain Score 01/20/18 1738 7     Pain Loc --      Pain Edu? --      Excl. in GC? --      Constitutional: Alert and oriented. Well appearing and in no acute distress. Eyes: Conjunctivae are normal. PERRL. EOMI. Head: Atraumatic. ENT:      Ears:       Nose: No congestion/rhinnorhea.      Mouth/Throat: Mucous membranes are moist.  Neck: No stridor.  Is supple full range of motion  Cardiovascular: Normal rate, regular rhythm. Normal S1 and S2.  Good peripheral circulation. Respiratory: Normal respiratory effort without tachypnea or retractions. Lungs CTAB. Good air entry to the bases with no decreased or absent breath sounds. Gastrointestinal: Bowel sounds 4 quadrants. Soft and nontender to palpation. No guarding or rigidity. No palpable masses. No distention. No CVA tenderness. Musculoskeletal: Full range of motion to all extremities. No gross deformities appreciated.  No deformities, no paradoxical chest wall movement.  Patient is tender to palpation in the intercostal space between ribs #7 and 8 and 11 and 12.  No palpable abnormality.  Good underlying breath sounds bilaterally. Neurologic:  Normal speech and language. No gross focal neurologic deficits are appreciated.  Skin:  Skin is warm, dry and intact. No rash noted. Psychiatric: Mood and affect are normal. Speech and behavior are normal. Patient exhibits appropriate insight and judgement.   ____________________________________________   LABS (all labs ordered are  listed, but only abnormal results are displayed)  Labs Reviewed - No data to display ____________________________________________  EKG   ____________________________________________  RADIOLOGY Festus Barren Johanthan Kneeland, personally viewed and evaluated these images (plain radiographs) as part of my medical decision making, as well as reviewing the written report by the radiologist.  Care with radiologist of no acute osseous abnormality or cardiopulmonary abnormality.  Dg Ribs Unilateral W/chest Right  Result Date: 01/20/2018 CLINICAL DATA:  Right-sided rib pain for the past few weeks. Pain when patient moves and breathes. Current smoker. EXAM: RIGHT RIBS AND CHEST - 3+ VIEW COMPARISON:  09/19/2017 CXR FINDINGS: No fracture or other bone lesions are seen involving the ribs. There is no evidence of pneumothorax or pleural effusion. Both lungs are clear. Heart size and mediastinal contours are within normal limits. IMPRESSION: No acute displaced rib fracture.  No acute pulmonary abnormality. Electronically Signed   By: Tollie Eth M.D.   On: 01/20/2018 18:41    ____________________________________________  PROCEDURES  Procedure(s) performed:    Procedures    Medications  dexamethasone (DECADRON) injection 10 mg (has no administration in time range)  HYDROcodone-acetaminophen (NORCO/VICODIN) 5-325 MG per tablet 1 tablet (has no administration in time range)     ____________________________________________   INITIAL IMPRESSION / ASSESSMENT AND PLAN / ED COURSE  Pertinent labs & imaging results that were available during my care of the patient were reviewed by me and considered in my medical decision making (see chart for details).  Review of the [place] CSRS was performed in accordance of the [facility] prior to dispensing any controlled drugs.     Patient's diagnosis is consistent with costochondritis.  Patient presents with a several week history of right rib pain.  On exam, patient  had tenderness to palpation in the intercostal region to reproduce pain.  Chest x-ray reveals no acute osseous abnormality.  Differential included rib fracture, contusion, referred pain, pneumothorax.  No indication for further work-up at this time.. Patient will be discharged home with prescriptions for steroid taper and very limited Ultram. Patient is to follow up with primary care as needed or otherwise directed. Patient is given ED precautions to return to the ED for any worsening or new symptoms.     ____________________________________________  FINAL CLINICAL IMPRESSION(S) / ED DIAGNOSES  Final diagnoses:  Costochondritis      NEW MEDICATIONS STARTED DURING THIS VISIT:  ED Discharge Orders        Ordered    predniSONE (DELTASONE) 10 MG tablet  Daily    Note to Pharmacy:  Take 6 pills x 2 days, 5 pills x 2 days, 4 pills x 2 days, 3 pills x 2 days, 2 pills x 2 days, and 1 pill x 2 days   01/20/18 1901    traMADol (ULTRAM) 50 MG tablet  Every 6 hours PRN     01/20/18 1901          This chart was dictated using voice recognition software/Dragon. Despite best efforts to proofread, errors can occur which can change the meaning. Any change was purely unintentional.    Racheal Patches, PA-C 01/20/18 1902    Myrna Blazer, MD 01/20/18 2024

## 2018-01-20 NOTE — ED Triage Notes (Signed)
Pt states that he is having right sided rib pain for the past few weeks, states that he only coughs when he is laying down, pt to move, and pain to breathe. Pt denies injury

## 2018-02-21 ENCOUNTER — Other Ambulatory Visit: Payer: Self-pay

## 2018-02-21 ENCOUNTER — Encounter: Payer: Self-pay | Admitting: Emergency Medicine

## 2018-02-21 ENCOUNTER — Emergency Department
Admission: EM | Admit: 2018-02-21 | Discharge: 2018-02-21 | Disposition: A | Discharge status: Home / Self Care | Payer: BLUE CROSS/BLUE SHIELD | Attending: Emergency Medicine | Admitting: Emergency Medicine

## 2018-02-21 ENCOUNTER — Emergency Department: Payer: BLUE CROSS/BLUE SHIELD

## 2018-02-21 DIAGNOSIS — W228XXA Striking against or struck by other objects, initial encounter: Secondary | ICD-10-CM | POA: Diagnosis not present

## 2018-02-21 DIAGNOSIS — S6992XA Unspecified injury of left wrist, hand and finger(s), initial encounter: Secondary | ICD-10-CM | POA: Diagnosis present

## 2018-02-21 DIAGNOSIS — S62645A Nondisplaced fracture of proximal phalanx of left ring finger, initial encounter for closed fracture: Secondary | ICD-10-CM | POA: Diagnosis not present

## 2018-02-21 DIAGNOSIS — Y999 Unspecified external cause status: Secondary | ICD-10-CM | POA: Diagnosis not present

## 2018-02-21 DIAGNOSIS — F17210 Nicotine dependence, cigarettes, uncomplicated: Secondary | ICD-10-CM | POA: Diagnosis not present

## 2018-02-21 DIAGNOSIS — Y929 Unspecified place or not applicable: Secondary | ICD-10-CM | POA: Insufficient documentation

## 2018-02-21 DIAGNOSIS — Y9389 Activity, other specified: Secondary | ICD-10-CM | POA: Diagnosis not present

## 2018-02-21 DIAGNOSIS — Z79899 Other long term (current) drug therapy: Secondary | ICD-10-CM | POA: Insufficient documentation

## 2018-02-21 MED ORDER — HYDROCODONE-ACETAMINOPHEN 5-325 MG PO TABS: 1.0000 | ORAL_TABLET | Freq: Four times a day (QID) | ORAL | 0 refills | Status: AC | PRN

## 2018-02-21 MED ORDER — NAPROXEN 500 MG PO TABS: 500.0000 mg | ORAL_TABLET | Freq: Two times a day (BID) | ORAL | Status: DC

## 2018-02-21 NOTE — ED Provider Notes (Signed)
[facility] Emergency Department Provider Note ____________________________________________  Time seen: Approximately 8:54 PM  I have reviewed the triage vital signs and the nursing notes.   HISTORY  Chief Complaint Finger Injury    HPI Lerone DAVIER TRAMELL is a 27 y.o. male who presents to the emergency department for evaluation and treatment of left ring finger pain. Injury while taking a tree down at work.Rope snapped off of the hitch and to protect his face he raised his hand. He heard and felt something snap. Incident occurred at about 11 this am. No alleviating measures attempted.  History reviewed. No pertinent past medical history.  There are no active problems to display for this patient.   History reviewed. No pertinent surgical history.  Prior to Admission medications   Medication Sig Start Date End Date Taking? Authorizing Provider  diclofenac (VOLTAREN) 50 MG EC tablet Take 1 tablet (50 mg total) by mouth 2 (two) times daily. Patient not taking: Reported on 09/19/2017 06/09/16   Tommi Rumps, PA-C  diphenhydrAMINE (BENADRYL) 25 mg capsule Take 1 capsule (25 mg total) by mouth every 4 (four) hours as needed. 09/06/16 09/16/16  Orvil Feil, PA-C  famotidine (PEPCID) 20 MG tablet Take 1 tablet (20 mg total) by mouth 2 (two) times daily. 09/19/17   Sharman Cheek, MD  HYDROcodone-acetaminophen (NORCO/VICODIN) 5-325 MG tablet Take 1 tablet by mouth every 6 (six) hours as needed for up to 3 days for severe pain. 02/21/18 02/24/18  Deloria Brassfield B, FNP  HYDROCORTISONE, TOPICAL, 2 % LOTN Apply 1 application topically 2 (two) times daily. Patient not taking: Reported on 09/19/2017 09/06/16   Orvil Feil, PA-C  meloxicam (MOBIC) 15 MG tablet Take 1 tablet (15 mg total) by mouth daily. Patient not taking: Reported on 09/19/2017 04/21/17   Cuthriell, Delorise Royals, PA-C  metoCLOPramide (REGLAN) 10 MG tablet Take 1 tablet (10 mg total) by mouth every 6 (six)  hours as needed for nausea or vomiting. Patient not taking: Reported on 09/19/2017 07/14/15   Myrna Blazer, MD  naproxen (NAPROSYN) 500 MG tablet Take 1 tablet (500 mg total) by mouth 2 (two) times daily with a meal. 02/21/18   Mylynn Dinh B, FNP  naproxen sodium (ANAPROX) 275 MG tablet Take 2 tablets (550 mg total) by mouth 2 (two) times daily with a meal. 09/19/17   Sharman Cheek, MD  neomycin-polymyxin-pramoxine (NEOSPORIN PLUS) 1 % cream Apply topically 2 (two) times daily. Patient not taking: Reported on 09/19/2017 09/02/16   Joni Reining, PA-C  predniSONE (DELTASONE) 10 MG tablet Take 1 tablet (10 mg total) by mouth daily. 01/20/18   Cuthriell, Delorise Royals, PA-C  ranitidine (ZANTAC) 300 MG tablet Take 1 tablet (300 mg total) by mouth at bedtime. 09/06/16 09/06/17  Orvil Feil, PA-C  sulfamethoxazole-trimethoprim (BACTRIM DS,SEPTRA DS) 800-160 MG tablet Take 1 tablet by mouth 2 (two) times daily. Patient not taking: Reported on 09/19/2017 09/02/16   Joni Reining, PA-C    Allergies Albuterol  No family history on file.  Social History Social History   Tobacco Use  . Smoking status: Current Some Day Smoker    Types: Cigarettes  . Smokeless tobacco: Never Used  Substance Use Topics  . Alcohol use: No  . Drug use: No    Review of Systems Constitutional: Negative for fever. Cardiovascular: Negative for chest pain. Respiratory: Negative for shortness of breath. Musculoskeletal: Positive for left ring finger pain. Skin: Negative for open wounds or lesion.  Neurological: Negative  for decrease in sensation  ____________________________________________   PHYSICAL EXAM:  VITAL SIGNS: ED Triage Vitals  Enc Vitals Group     BP 02/21/18 2038 (!) 136/97     Pulse Rate 02/21/18 2038 69     Resp 02/21/18 2038 18     Temp 02/21/18 2038 98.1 F (36.7 C)     Temp Source 02/21/18 2038 Oral     SpO2 02/21/18 2038 100 %     Weight 02/21/18 2037 220 lb (99.8 kg)      Height 02/21/18 2037 6' (1.829 m)     Head Circumference --      Peak Flow --      Pain Score 02/21/18 2037 10     Pain Loc --      Pain Edu? --      Excl. in GC? --     Constitutional: Alert and oriented. Well appearing and in no acute distress. Eyes: Conjunctivae are clear without discharge or drainage Head: Atraumatic Neck: Supple Respiratory: No cough. Respirations are even and unlabored. Musculoskeletal: Pain in the middle and proximal phalanx without deformity. Neurologic: Sensation of the left ring finger and hand is intact.  Skin: Mild swelling over the left ring finger.  Psychiatric: Affect and behavior are appropriate.  ____________________________________________   LABS (all labs ordered are listed, but only abnormal results are displayed)  Labs Reviewed - No data to display ____________________________________________  RADIOLOGY  Fracture involves the base of the 4th middle phalanx. ____________________________________________   PROCEDURES  .Splint Application Date/Time: 02/21/2018 9:41 PM Performed by: Ainsley SpinnerMendoza, Mayra, NT Authorized by: Chinita Pesterriplett, Bretton Tandy B, FNP   Consent:    Consent obtained:  Verbal   Consent given by:  Patient   Risks discussed:  Pain Pre-procedure details:    Sensation:  Normal Procedure details:    Laterality:  Left   Location:  Finger   Finger:  L ring finger   Supplies:  Aluminum splint Post-procedure details:    Pain:  Unchanged   Sensation:  Normal   Patient tolerance of procedure:  Tolerated well, no immediate complications    ____________________________________________   INITIAL IMPRESSION / ASSESSMENT AND PLAN / ED COURSE  Willard Everitt AmberS Nachtigal is a 27 y.o. who presents to the emergency department for evaluation of left hand injury. Static finger splint applied. He is to follow up with orthopedics. He is to return tot he ER for symptoms that change or worsen or for new concerns.  Medications - No data to  display  Pertinent labs & imaging results that were available during my care of the patient were reviewed by me and considered in my medical decision making (see chart for details).  _________________________________________   FINAL CLINICAL IMPRESSION(S) / ED DIAGNOSES  Final diagnoses:  Closed nondisplaced fracture of proximal phalanx of left ring finger, initial encounter    ED Discharge Orders        Ordered    naproxen (NAPROSYN) 500 MG tablet  2 times daily with meals     02/21/18 2132    HYDROcodone-acetaminophen (NORCO/VICODIN) 5-325 MG tablet  Every 6 hours PRN     02/21/18 2132       If controlled substance prescribed during this visit, 12 month history viewed on the [facility] prior to issuing an initial prescription for Schedule II or III opiod.    Chinita Pesterriplett, Luba Matzen B, FNP 02/21/18 2141    Dionne BucySiadecki, Sebastian, MD 02/21/18 2315

## 2018-02-21 NOTE — ED Notes (Signed)
Pt presents to ED with pain to his 4th digit on his left hand. Pt states he was pulling tension on large rope while falling a tree and prematurely let go of the rope and it hit his finger with a large amount of force. Pt states he is unable to move affected finger. Slight swelling noted with no obvious deformity. No jewelry noted to hand. Tender with palpation.

## 2018-02-21 NOTE — ED Triage Notes (Signed)
Patient ambulatory to triage with steady gait, without difficulty or distress noted; pt reports left 4th finger pain; st today injured while taking down a tree at work; declines to file workers comp at this time

## 2018-06-08 ENCOUNTER — Emergency Department
Admission: EM | Admit: 2018-06-08 | Discharge: 2018-06-08 | Disposition: A | Discharge status: Home / Self Care | Payer: BLUE CROSS/BLUE SHIELD | Attending: Emergency Medicine | Admitting: Emergency Medicine

## 2018-06-08 ENCOUNTER — Other Ambulatory Visit: Payer: Self-pay

## 2018-06-08 DIAGNOSIS — F17210 Nicotine dependence, cigarettes, uncomplicated: Secondary | ICD-10-CM | POA: Diagnosis not present

## 2018-06-08 DIAGNOSIS — R05 Cough: Secondary | ICD-10-CM | POA: Diagnosis not present

## 2018-06-08 DIAGNOSIS — Z0279 Encounter for issue of other medical certificate: Secondary | ICD-10-CM | POA: Diagnosis not present

## 2018-06-08 DIAGNOSIS — J069 Acute upper respiratory infection, unspecified: Secondary | ICD-10-CM | POA: Diagnosis not present

## 2018-06-08 DIAGNOSIS — B9789 Other viral agents as the cause of diseases classified elsewhere: Secondary | ICD-10-CM | POA: Diagnosis not present

## 2018-06-08 DIAGNOSIS — R1110 Vomiting, unspecified: Secondary | ICD-10-CM | POA: Diagnosis present

## 2018-06-08 DIAGNOSIS — R197 Diarrhea, unspecified: Secondary | ICD-10-CM | POA: Diagnosis not present

## 2018-06-08 DIAGNOSIS — A084 Viral intestinal infection, unspecified: Secondary | ICD-10-CM

## 2018-06-08 MED ORDER — ONDANSETRON 4 MG PO TBDP: 4.0000 mg | ORAL_TABLET | Freq: Once | ORAL | Status: DC

## 2018-06-08 MED ORDER — PSEUDOEPH-BROMPHEN-DM 30-2-10 MG/5ML PO SYRP: 5.0000 mL | ORAL_SOLUTION | Freq: Four times a day (QID) | ORAL | 0 refills | Status: DC | PRN

## 2018-06-08 NOTE — ED Provider Notes (Signed)
[facility] Emergency Department Provider Note  ____________________________________________   First MD Initiated Contact with Patient 06/08/18 0809     (approximate)  I have reviewed the triage vital signs and the nursing notes.   HISTORY  Chief Complaint Emesis  HPI Jackson Jimenez is a 27 y.o. male presents to the ED with complaint of vomiting x3 yesterday.  He states he also had diarrhea twice in the last 24 hours.  He states that he has had upper respiratory symptoms and has been coughing.  He took NyQuil with minimal relief.  He denies any known fever or chills.  He states that his daughter has URI symptoms but no vomiting or diarrhea.  Patient did eat food at 6:30 AM and was able to keep it down.  There is been no diarrhea since yesterday.  Patient is requesting a note for work.  History reviewed. No pertinent past medical history.  There are no active problems to display for this patient.   History reviewed. No pertinent surgical history.  Prior to Admission medications   Medication Sig Start Date End Date Taking? Authorizing Provider  brompheniramine-pseudoephedrine-DM 30-2-10 MG/5ML syrup Take 5 mLs by mouth 4 (four) times daily as needed. 06/08/18   Tommi Rumps, PA-C  diphenhydrAMINE (BENADRYL) 25 mg capsule Take 1 capsule (25 mg total) by mouth every 4 (four) hours as needed. 09/06/16 09/16/16  Orvil Feil, PA-C  ranitidine (ZANTAC) 300 MG tablet Take 1 tablet (300 mg total) by mouth at bedtime. 09/06/16 09/06/17  Orvil Feil, PA-C    Allergies Albuterol  History reviewed. No pertinent family history.  Social History Social History   Tobacco Use  . Smoking status: Current Some Day Smoker    Types: Cigarettes  . Smokeless tobacco: Never Used  Substance Use Topics  . Alcohol use: No  . Drug use: No    Review of Systems Constitutional: No fever/chills Eyes: No visual changes. ENT: No sore throat.  Positive nasal  congestion. Cardiovascular: Denies chest pain. Respiratory: Denies shortness of breath.  Positive nonproductive cough. Gastrointestinal: No abdominal pain.  Positive nausea, positive vomiting.  Positive for diarrhea. Musculoskeletal: Negative for muscle skeletal pain. Skin: Negative for rash. Neurological: Negative for headaches, focal weakness or numbness. ___________________________________________   PHYSICAL EXAM:  VITAL SIGNS: ED Triage Vitals  Enc Vitals Group     BP 06/08/18 0745 129/74     Pulse Rate 06/08/18 0745 65     Resp --      Temp 06/08/18 0745 98.2 F (36.8 C)     Temp Source 06/08/18 0745 Oral     SpO2 06/08/18 0745 100 %     Weight 06/08/18 0746 220 lb (99.8 kg)     Height 06/08/18 0746 6' (1.829 m)     Head Circumference --      Peak Flow --      Pain Score 06/08/18 0801 0     Pain Loc --      Pain Edu? --      Excl. in GC? --    Constitutional: Alert and oriented. Well appearing and in no acute distress. Eyes: Conjunctivae are normal.  Head: Atraumatic. Nose: Mild congestion/rhinnorhea.  EACs and TMs are clear bilaterally. Mouth/Throat: Mucous membranes are moist.  Oropharynx non-erythematous. Neck: No stridor.   Hematological/Lymphatic/Immunilogical: No cervical lymphadenopathy. Cardiovascular: Normal rate, regular rhythm. Grossly normal heart sounds.  Good peripheral circulation. Respiratory: Normal respiratory effort.  No retractions. Lungs CTAB. Gastrointestinal: Soft and nontender.  No distention. No CVA tenderness. Musculoskeletal: No lower extremity tenderness nor edema.  No joint effusions. Neurologic:  Normal speech and language. No gross focal neurologic deficits are appreciated.  Skin:  Skin is warm, dry and intact. Psychiatric: Mood and affect are normal. Speech and behavior are normal.  ____________________________________________   LABS (all labs ordered are listed, but only abnormal results are displayed)  Labs Reviewed - No data to  display  PROCEDURES  Procedure(s) performed: None  Procedures  Critical Care performed: No  ____________________________________________   INITIAL IMPRESSION / ASSESSMENT AND PLAN / ED COURSE  As part of my medical decision making, I reviewed the following data within the electronic MEDICAL RECORD NUMBER Notes from prior ED visits and [place] Controlled Substance Database  Patient presents to the ED with complaint of upper respiratory symptoms along with limited vomiting and diarrhea.  He was able to eat today without any difficulties and no continued vomiting or diarrhea.  Patient took NyQuil last evening for upper respiratory symptoms with minimal relief.  He also is requesting a note for work.  Physical exam is consistent with a viral gastroenteritis and URI.  Patient was given a prescription for Bromfed-DM 4 times daily as needed for symptoms.  He is encouraged to remain on clear liquids.  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  He is also encouraged to take Tylenol if needed for fever or body aches.  ____________________________________________   FINAL CLINICAL IMPRESSION(S) / ED DIAGNOSES  Final diagnoses:  Viral gastroenteritis  Viral URI with cough     ED Discharge Orders         Ordered    brompheniramine-pseudoephedrine-DM 30-2-10 MG/5ML syrup  4 times daily PRN     06/08/18 0836           Note:  This document was prepared using Dragon voice recognition software and may include unintentional dictation errors.    Tommi Rumps, PA-C 06/08/18 1610    Jene Every, MD 06/08/18 [phone]

## 2018-06-08 NOTE — ED Triage Notes (Signed)
Pt arrives to ed via POV, ambulatory to triage, with complaints of nausea, emesis started yesterday x 3, runny nose, cough. Pt denies any vomiting this morning. NAD noted at this time. Pt states eat food at 630 and was able to keep it down.

## 2018-06-08 NOTE — Discharge Instructions (Addendum)
Follow-up with your primary care provider or [facility] acute care if any continued problems.  Remain on clear liquids as discussed.  Begin taking Bromfed-DM as needed for cough and congestion.  Tylenol or ibuprofen as needed for fever or body aches.  Gradually advance your diet tomorrow if no continued vomiting or diarrhea.  Begin with bananas, rice, applesauce or toast and advance from there.

## 2018-06-08 NOTE — ED Notes (Signed)
Vomiting yesterday x3 times "chunks of food coming back up". Diarrhea x1 before throwing up yesterday. Today woke up and "head really hot and my body is cold". Pt fatigued, stuffy nose and cough. Daughter has stuffy nose but no GI symptoms.

## 2018-08-10 ENCOUNTER — Other Ambulatory Visit: Payer: Self-pay

## 2018-08-10 ENCOUNTER — Encounter: Payer: Self-pay | Admitting: Emergency Medicine

## 2018-08-10 DIAGNOSIS — K047 Periapical abscess without sinus: Secondary | ICD-10-CM | POA: Insufficient documentation

## 2018-08-10 DIAGNOSIS — F17210 Nicotine dependence, cigarettes, uncomplicated: Secondary | ICD-10-CM | POA: Insufficient documentation

## 2018-08-10 DIAGNOSIS — K0889 Other specified disorders of teeth and supporting structures: Secondary | ICD-10-CM | POA: Diagnosis not present

## 2018-08-10 DIAGNOSIS — R220 Localized swelling, mass and lump, head: Secondary | ICD-10-CM | POA: Diagnosis present

## 2018-08-10 NOTE — ED Triage Notes (Signed)
Pt arrives ambulatory to triage with c/o dental swelling on the left since he woke up at 1400. Pt reports that "I don't feel nothin right now". Pt reports taking 800 mg Ibuprofen for HA before he laid down. Pt is in NAD.

## 2018-08-11 ENCOUNTER — Emergency Department
Admission: EM | Admit: 2018-08-11 | Discharge: 2018-08-11 | Disposition: A | Discharge status: Home / Self Care | Payer: BLUE CROSS/BLUE SHIELD | Attending: Emergency Medicine | Admitting: Emergency Medicine

## 2018-08-11 DIAGNOSIS — K047 Periapical abscess without sinus: Secondary | ICD-10-CM

## 2018-08-11 MED ORDER — PREDNISONE 20 MG PO TABS
60.0000 mg | ORAL_TABLET | Freq: Once | ORAL | Status: AC
  Administered 2018-08-11: 60 mg via ORAL

## 2018-08-11 MED ORDER — CEPHALEXIN 500 MG PO CAPS: 500.0000 mg | ORAL_CAPSULE | Freq: Two times a day (BID) | ORAL | 0 refills | Status: AC

## 2018-08-11 MED ORDER — PREDNISONE 20 MG PO TABS: 40.0000 mg | ORAL_TABLET | Freq: Every day | ORAL | 0 refills | Status: DC

## 2018-08-11 MED ORDER — PREDNISONE 20 MG PO TABS: 40.0000 mg | ORAL_TABLET | Freq: Every day | ORAL | 0 refills | Status: AC

## 2018-08-11 MED ORDER — PENICILLIN V POTASSIUM 250 MG PO TABS
500.0000 mg | ORAL_TABLET | Freq: Four times a day (QID) | ORAL | Status: DC
  Administered 2018-08-11: 500 mg via ORAL

## 2018-08-11 MED ORDER — PENICILLIN V POTASSIUM 250 MG PO TABS
ORAL_TABLET | ORAL | Status: AC
  Filled 2018-08-11: qty 2

## 2018-08-11 MED ORDER — CEPHALEXIN 500 MG PO CAPS: 500.0000 mg | ORAL_CAPSULE | Freq: Two times a day (BID) | ORAL | 0 refills | Status: DC

## 2018-08-11 NOTE — ED Provider Notes (Signed)
[facility] Emergency Department Provider Note   First MD Initiated Contact with Patient 08/11/18 0301     (approximate)  I have reviewed the triage vital signs and the nursing notes.   HISTORY  Chief Complaint Oral Swelling    HPI Jackson Jimenez is a 27 y.o. male presents to the emergency department with left jaw swelling and dental pain which began at 2:00 PM yesterday afternoon.  Patient does admit to multiple dental caries.  Patient denies any fever no difficulty swallowing.  Past medical history Dental caries There are no active problems to display for this patient.   History reviewed. No pertinent surgical history.  Prior to Admission medications   Medication Sig Start Date End Date Taking? Authorizing Provider  brompheniramine-pseudoephedrine-DM 30-2-10 MG/5ML syrup Take 5 mLs by mouth 4 (four) times daily as needed. 06/08/18   Tommi RumpsSummers, Rhonda L, PA-C  cephALEXin (KEFLEX) 500 MG capsule Take 1 capsule (500 mg total) by mouth 2 (two) times daily for 10 days. 08/11/18 08/21/18  Darci CurrentBrown, [place] N, MD  diphenhydrAMINE (BENADRYL) 25 mg capsule Take 1 capsule (25 mg total) by mouth every 4 (four) hours as needed. 09/06/16 09/16/16  Orvil FeilWoods, Jaclyn M, PA-C  predniSONE (DELTASONE) 20 MG tablet Take 2 tablets (40 mg total) by mouth daily for 5 days. 08/11/18 08/16/18  Darci CurrentBrown, [place] N, MD  ranitidine (ZANTAC) 300 MG tablet Take 1 tablet (300 mg total) by mouth at bedtime. 09/06/16 09/06/17  Orvil FeilWoods, Jaclyn M, PA-C    Allergies Albuterol  No family history on file.  Social History Social History   Tobacco Use  . Smoking status: Current Some Day Smoker    Types: Cigarettes  . Smokeless tobacco: Never Used  Substance Use Topics  . Alcohol use: No  . Drug use: No    Review of Systems Constitutional: No fever/chills Eyes: No visual changes. ENT: No sore throat.  Positive for dental pain and left jaw swelling Cardiovascular: Denies chest  pain. Respiratory: Denies shortness of breath. Gastrointestinal: No abdominal pain.  No nausea, no vomiting.  No diarrhea.  No constipation. Genitourinary: Negative for dysuria. Musculoskeletal: Negative for neck pain.  Negative for back pain. Integumentary: Negative for rash. Neurological: Negative for headaches, focal weakness or numbness.   ____________________________________________   PHYSICAL EXAM:  VITAL SIGNS: ED Triage Vitals  Enc Vitals Group     BP 08/10/18 2356 125/85     Pulse Rate 08/10/18 2356 83     Resp 08/10/18 2356 18     Temp 08/10/18 2356 98.8 F (37.1 C)     Temp Source 08/10/18 2356 Oral     SpO2 08/10/18 2356 99 %     Weight 08/10/18 2357 100.2 kg (221 lb)     Height 08/10/18 2357 1.829 m (6')     Head Circumference --      Peak Flow --      Pain Score 08/10/18 2357 5     Pain Loc --      Pain Edu? --      Excl. in GC? --     Constitutional: Alert and oriented. Well appearing and in no acute distress. Eyes: Conjunctivae are normal.  Head: Atraumatic. Mouth/Throat: Mucous membranes are moist. Oropharynx non-erythematous.  Multiple dental caries noted.  Left mandible molar caries with surrounding swelling.   Neck: No stridor.  No cervical lymphadenopathy noted. Cardiovascular: Normal rate, regular rhythm. Good peripheral circulation. Grossly normal heart sounds. Respiratory: Normal respiratory effort.  No retractions. Lungs  CTAB. Gastrointestinal: Soft and nontender. No distention.  Musculoskeletal: No lower extremity tenderness nor edema. No gross deformities of extremities. Neurologic:  Normal speech and language. No gross focal neurologic deficits are appreciated.  Skin:  Skin is warm, dry and intact. No rash noted.    _________________ Procedures   ____________________________________________   INITIAL IMPRESSION / ASSESSMENT AND PLAN / ED COURSE  As part of my medical decision making, I reviewed the following data within the  electronic MEDICAL RECORD NUMBER 27 year old male presented with above-stated history and physical exam secondary to dental caries with concern for possible dental abscess.  Patient given penicillin the emergency department.  Patient will be referred to dentist for further outpatient evaluation and management. ____________________________________________  FINAL CLINICAL IMPRESSION(S) / ED DIAGNOSES  Final diagnoses:  Dental abscess     MEDICATIONS GIVEN DURING THIS VISIT:  Medications  penicillin v potassium (VEETID) tablet 500 mg (500 mg Oral Given 08/11/18 0310)  penicillin v potassium (VEETID) 250 MG tablet (has no administration in time range)  predniSONE (DELTASONE) tablet 60 mg (60 mg Oral Given 08/11/18 0437)     ED Discharge Orders         Ordered    cephALEXin (KEFLEX) 500 MG capsule  2 times daily,   Status:  Discontinued     08/11/18 0345    cephALEXin (KEFLEX) 500 MG capsule  2 times daily     08/11/18 0345    predniSONE (DELTASONE) 20 MG tablet  Daily,   Status:  Discontinued     08/11/18 0432    predniSONE (DELTASONE) 20 MG tablet  Daily     08/11/18 0432           Note:  This document was prepared using Dragon voice recognition software and may include unintentional dictation errors.    Darci Current, MD 08/11/18 [phone]

## 2020-05-22 ENCOUNTER — Other Ambulatory Visit: Payer: Self-pay

## 2020-05-22 ENCOUNTER — Emergency Department
Admission: EM | Admit: 2020-05-22 | Discharge: 2020-05-22 | Disposition: A | Discharge status: Home / Self Care | Payer: Self-pay | Attending: Emergency Medicine | Admitting: Emergency Medicine

## 2020-05-22 ENCOUNTER — Emergency Department: Payer: Self-pay

## 2020-05-22 DIAGNOSIS — Z79899 Other long term (current) drug therapy: Secondary | ICD-10-CM | POA: Insufficient documentation

## 2020-05-22 DIAGNOSIS — F17210 Nicotine dependence, cigarettes, uncomplicated: Secondary | ICD-10-CM | POA: Insufficient documentation

## 2020-05-22 DIAGNOSIS — M545 Low back pain, unspecified: Secondary | ICD-10-CM

## 2020-05-22 MED ORDER — OXYCODONE HCL 5 MG PO TABS
5.0000 mg | ORAL_TABLET | Freq: Once | ORAL | Status: AC
  Administered 2020-05-22: 5 mg via ORAL
  Filled 2020-05-22: qty 1

## 2020-05-22 MED ORDER — CYCLOBENZAPRINE HCL 10 MG PO TABS
10.0000 mg | ORAL_TABLET | Freq: Once | ORAL | Status: AC
  Administered 2020-05-22: 10 mg via ORAL
  Filled 2020-05-22: qty 1

## 2020-05-22 MED ORDER — CYCLOBENZAPRINE HCL 10 MG PO TABS: 5.0000 mg | ORAL_TABLET | Freq: Three times a day (TID) | ORAL | 0 refills | Status: DC | PRN

## 2020-05-22 MED ORDER — HYDROCODONE-ACETAMINOPHEN 5-325 MG PO TABS: 1.0000 | ORAL_TABLET | Freq: Four times a day (QID) | ORAL | 0 refills | Status: AC | PRN

## 2020-05-22 NOTE — ED Triage Notes (Signed)
First nurse note- here for fall the other day at work. Did not hit head.  Does not want worker comp.  Also c/o nausea and vomiting. Ambulatory, NAD

## 2020-05-22 NOTE — ED Provider Notes (Signed)
[facility] Emergency Department Provider Note ____________________________________________   First MD Initiated Contact with Patient 05/22/20 [phone]     (approximate)  I have reviewed the triage vital signs and the nursing notes.   HISTORY  Chief Complaint Fall  HPI Jackson Jimenez is a 29 y.o. male with history of "pinched nerve" in his back presents to the emergency department for treatment and evaluation of mid to low back pain.  Patient reports that he sustained a mechanical, nonsyncopal fall while at work when he was trying to catch a truss and he fell backward onto some metal poles that were lying on the ground.  He states that he was just initially sore but then when he went to work today, he was again trying to catch the truss as it came up and it "snapped" him back.  He had immediate pain and burning sensation which has not let up since. He also reports having an episode of vomiting afterward. No alleviating measures attempted prior to arrival.         History reviewed. No pertinent past medical history.  There are no problems to display for this patient.   History reviewed. No pertinent surgical history.  Prior to Admission medications   Medication Sig Start Date End Date Taking? Authorizing Provider  brompheniramine-pseudoephedrine-DM 30-2-10 MG/5ML syrup Take 5 mLs by mouth 4 (four) times daily as needed. 06/08/18   Tommi Rumps, PA-C  cyclobenzaprine (FLEXERIL) 10 MG tablet Take 0.5 tablets (5 mg total) by mouth 3 (three) times daily as needed for muscle spasms. 05/22/20   Celestino Ackerman, Rulon Eisenmenger B, FNP  diphenhydrAMINE (BENADRYL) 25 mg capsule Take 1 capsule (25 mg total) by mouth every 4 (four) hours as needed. 09/06/16 09/16/16  Orvil Feil, PA-C  HYDROcodone-acetaminophen (NORCO/VICODIN) 5-325 MG tablet Take 1 tablet by mouth every 6 (six) hours as needed for up to 3 days for severe pain. 05/22/20 05/25/20  Shaquasha Gerstel, Kasandra Knudsen, FNP  ranitidine  (ZANTAC) 300 MG tablet Take 1 tablet (300 mg total) by mouth at bedtime. 09/06/16 09/06/17  Orvil Feil, PA-C    Allergies Albuterol  No family history on file.  Social History Social History   Tobacco Use   Smoking status: Current Some Day Smoker    Types: Cigarettes   Smokeless tobacco: Never Used  Building services engineer Use: Never used  Substance Use Topics   Alcohol use: No   Drug use: No    Review of Systems  Constitutional: No fever/chills Eyes: No visual changes. ENT: No sore throat. Cardiovascular: Denies chest pain. Respiratory: Denies shortness of breath. Gastrointestinal: No abdominal pain.  Positive for vomiting.  Genitourinary: Negative for dysuria. Musculoskeletal: Positive for back pain. Skin: Negative for rash. Neurological: Negative for headaches, focal weakness or numbness. ____________________________________________   PHYSICAL EXAM:  VITAL SIGNS: ED Triage Vitals  Enc Vitals Group     BP 05/22/20 0823 (!) 148/86     Pulse Rate 05/22/20 0823 83     Resp 05/22/20 0823 19     Temp 05/22/20 0823 98.1 F (36.7 C)     Temp Source 05/22/20 0823 Oral     SpO2 05/22/20 0823 100 %     Weight 05/22/20 0821 200 lb (90.7 kg)     Height 05/22/20 0821 6' (1.829 m)     Head Circumference --      Peak Flow --      Pain Score 05/22/20 0821 10  Pain Loc --      Pain Edu? --      Excl. in GC? --     Constitutional: Alert and oriented. Uncomfortable appearing and in no acute distress. Eyes: Conjunctivae are normal. Head: Atraumatic. Nose: No congestion/rhinnorhea. Mouth/Throat: Mucous membranes are moist.  Oropharynx non-erythematous. Neck: No stridor.   Cardiovascular: Normal rate, regular rhythm. Grossly normal heart sounds.  Good peripheral circulation. Respiratory: Normal respiratory effort.  No retractions. Lungs CTAB. Gastrointestinal: Soft and nontender. No distention. No abdominal bruits.  Genitourinary:  Musculoskeletal: No lower  extremity tenderness nor edema.  Diffuse right side lower thoracic and lumbar tenderness.  Neurologic:  Normal speech and language. No gross focal neurologic deficits are appreciated. No gait instability. Skin:  Skin is warm, dry and intact. No rash noted. Psychiatric: Mood and affect are normal. Speech and behavior are normal.  ____________________________________________   LABS (all labs ordered are listed, but only abnormal results are displayed)  Labs Reviewed - No data to display ____________________________________________  EKG  Not indicated ____________________________________________  RADIOLOGY  ED MD interpretation:    Images of the lumbar and thoracic spine are negative for acute findings.  There is some mild degenerative changes at L5-S1 where there also may be some pars defects.  I, Kem Boroughs, personally viewed and evaluated these images (plain radiographs) as part of my medical decision making, as well as reviewing the written report by the radiologist.  Official radiology report(s): DG Thoracic Spine 2 View  Result Date: 05/22/2020 CLINICAL DATA:  Acute pain after fall. EXAM: THORACIC SPINE 2 VIEWS COMPARISON:  None. FINDINGS: There is no evidence of thoracic spine fracture. Alignment is normal. No other significant bone abnormalities are identified. IMPRESSION: Negative. Electronically Signed   By: Feliberto Harts MD   On: 05/22/2020 10:50   DG Lumbar Spine 2-3 Views  Result Date: 05/22/2020 CLINICAL DATA:  Acute pain after fall. EXAM: LUMBAR SPINE - 2-3 VIEW COMPARISON:  01/29/2016. FINDINGS: There is no evidence of lumbar spine fracture. Mild anterior wedging of the lower thoracic vertebral bodies is chronic when comparing to prior. Alignment is normal. Mild degenerative changes at L5-S1 where there may be pars defects IMPRESSION: 1. No acute fracture or malalignment. 2. Mild degenerative changes at L5-S1 where there may be pars defects. Electronically Signed    By: Feliberto Harts MD   On: 05/22/2020 10:53    ____________________________________________   PROCEDURES  Procedure(s) performed (including Critical Care):  Procedures  ____________________________________________   INITIAL IMPRESSION / ASSESSMENT AND PLAN     29 year old male presenting to the emergency department for treatment and evaluation after a back injury while at work.  See HPI for further details.  Plan will be to get images of the thoracic and lumbar spine and administer medication.  He does appear to be quite uncomfortable with any movement.  DIFFERENTIAL DIAGNOSIS  Compression fracture, herniated disc, degenerative disc disease, musculoskeletal strain.  ED COURSE  Images are reassuring.  While here he was given Roxicodone and Flexeril with significant improvement in pain.  He will be provided with a work excuse for the day.  He did request to return to work on Monday.  Prescriptions for Flexeril and Norco submitted to his pharmacy.  He was advised to take these only as prescribed.  He is advised to see his primary care provider, orthopedics, or return to the emergency department if symptoms change or worsen.    ___________________________________________   FINAL CLINICAL IMPRESSION(S) / ED DIAGNOSES  Final diagnoses:  Acute right-sided low back pain without sciatica     ED Discharge Orders         Ordered    cyclobenzaprine (FLEXERIL) 10 MG tablet  3 times daily PRN        05/22/20 1104    HYDROcodone-acetaminophen (NORCO/VICODIN) 5-325 MG tablet  Every 6 hours PRN        05/22/20 1104           Alok S Lazarz was evaluated in Emergency Department on 05/22/2020 for the symptoms described in the history of present illness. He was evaluated in the context of the global COVID-19 pandemic, which necessitated consideration that the patient might be at risk for infection with the SARS-CoV-2 virus that causes COVID-19. Institutional protocols and  algorithms that pertain to the evaluation of patients at risk for COVID-19 are in a state of rapid change based on information released by regulatory bodies including the CDC and federal and state organizations. These policies and algorithms were followed during the patient's care in the ED.   Note:  This document was prepared using Dragon voice recognition software and may include unintentional dictation errors.   Chinita Pester, FNP 05/22/20 1359    Gilles Chiquito, MD 05/22/20 [phone]

## 2020-05-22 NOTE — ED Triage Notes (Addendum)
Pt comes via POV from home with c/o fall that happened yesterday at work. Pt states he just tripped and fell onto a metal piece. Pt has abrasions noted to back. Pt denies hitting head. Pt states pain to back and right side.  Pt states some N/V this am.

## 2020-05-22 NOTE — Discharge Instructions (Signed)
Please follow up with orthopedics if not improving over the week.  Return to the ER for symptoms that change or worsen if unable to schedule an appointment. 

## 2020-08-27 ENCOUNTER — Other Ambulatory Visit: Payer: Self-pay

## 2020-08-27 ENCOUNTER — Emergency Department: Payer: Self-pay

## 2020-08-27 ENCOUNTER — Encounter: Payer: Self-pay | Admitting: *Deleted

## 2020-08-27 DIAGNOSIS — M545 Low back pain, unspecified: Secondary | ICD-10-CM | POA: Insufficient documentation

## 2020-08-27 DIAGNOSIS — R0602 Shortness of breath: Secondary | ICD-10-CM | POA: Insufficient documentation

## 2020-08-27 DIAGNOSIS — G8929 Other chronic pain: Secondary | ICD-10-CM | POA: Insufficient documentation

## 2020-08-27 DIAGNOSIS — H538 Other visual disturbances: Secondary | ICD-10-CM | POA: Insufficient documentation

## 2020-08-27 DIAGNOSIS — R61 Generalized hyperhidrosis: Secondary | ICD-10-CM | POA: Insufficient documentation

## 2020-08-27 DIAGNOSIS — Z87891 Personal history of nicotine dependence: Secondary | ICD-10-CM | POA: Insufficient documentation

## 2020-08-27 DIAGNOSIS — Z20822 Contact with and (suspected) exposure to covid-19: Secondary | ICD-10-CM | POA: Insufficient documentation

## 2020-08-27 DIAGNOSIS — R42 Dizziness and giddiness: Secondary | ICD-10-CM | POA: Insufficient documentation

## 2020-08-27 DIAGNOSIS — R531 Weakness: Secondary | ICD-10-CM | POA: Insufficient documentation

## 2020-08-27 DIAGNOSIS — B349 Viral infection, unspecified: Secondary | ICD-10-CM | POA: Insufficient documentation

## 2020-08-27 LAB — URINALYSIS, COMPLETE (UACMP) WITH MICROSCOPIC
Bacteria, UA: NONE SEEN
Bilirubin Urine: NEGATIVE
Glucose, UA: NEGATIVE mg/dL
Hgb urine dipstick: NEGATIVE
Ketones, ur: NEGATIVE mg/dL
Leukocytes,Ua: NEGATIVE
Nitrite: NEGATIVE
Protein, ur: NEGATIVE mg/dL
Specific Gravity, Urine: 1.018 (ref 1.005–1.030)
Squamous Epithelial / LPF: NONE SEEN (ref 0–5)
WBC, UA: NONE SEEN WBC/hpf (ref 0–5)
pH: 7 (ref 5.0–8.0)

## 2020-08-27 LAB — BASIC METABOLIC PANEL
Anion gap: 11 (ref 5–15)
BUN: 16 mg/dL (ref 6–20)
CO2: 25 mmol/L (ref 22–32)
Calcium: 9.5 mg/dL (ref 8.9–10.3)
Chloride: 104 mmol/L (ref 98–111)
Creatinine, Ser: 0.84 mg/dL (ref 0.61–1.24)
GFR, Estimated: >60 mL/min (ref >60)
Glucose, Bld: 98 mg/dL (ref 70–99)
Potassium: 4.1 mmol/L (ref 3.5–5.1)
Sodium: 140 mmol/L (ref 135–145)

## 2020-08-27 LAB — RESP PANEL BY RT-PCR (FLU A&B, COVID) ARPGX2
Influenza A by PCR: NEGATIVE
Influenza B by PCR: NEGATIVE
SARS Coronavirus 2 by RT PCR: NEGATIVE

## 2020-08-27 LAB — CBC
HCT: 46.5 % (ref 39.0–52.0)
Hemoglobin: 15.6 g/dL (ref 13.0–17.0)
MCH: 27.9 pg (ref 26.0–34.0)
MCHC: 33.5 g/dL (ref 30.0–36.0)
MCV: 83 fL (ref 80.0–100.0)
Platelets: 225 10*3/uL (ref 150–400)
RBC: 5.6 MIL/uL (ref 4.22–5.81)
RDW: 12.8 % (ref 11.5–15.5)
WBC: 7 10*3/uL (ref 4.0–10.5)
nRBC: 0 % (ref 0.0–0.2)

## 2020-08-27 NOTE — ED Triage Notes (Signed)
Pt to ED reporting blurred vision, weakness, cough, congestion and SOB for the past couple days. No known fevers.   Pt reports he is concerned for COVID or mold exposure. Pt reports she found mold in his house three weeks ago and has been sleeping near the mold. No NVD. Pt is very diaphoretic in triage and appears fatigued.

## 2020-08-28 ENCOUNTER — Emergency Department
Admission: EM | Admit: 2020-08-28 | Discharge: 2020-08-28 | Disposition: A | Discharge status: Home / Self Care | Payer: Self-pay | Attending: Emergency Medicine | Admitting: Emergency Medicine

## 2020-08-28 DIAGNOSIS — B349 Viral infection, unspecified: Secondary | ICD-10-CM

## 2020-08-28 DIAGNOSIS — M545 Low back pain, unspecified: Secondary | ICD-10-CM

## 2020-08-28 DIAGNOSIS — G8929 Other chronic pain: Secondary | ICD-10-CM

## 2020-08-28 MED ORDER — LIDOCAINE 5 % EX PTCH
1.0000 | MEDICATED_PATCH | Freq: Once | CUTANEOUS | Status: DC
  Administered 2020-08-28: 1 via TRANSDERMAL
  Filled 2020-08-28: qty 1

## 2020-08-28 MED ORDER — KETOROLAC TROMETHAMINE 30 MG/ML IJ SOLN
30.0000 mg | Freq: Once | INTRAMUSCULAR | Status: AC
  Administered 2020-08-28: 30 mg via INTRAMUSCULAR
  Filled 2020-08-28: qty 1

## 2020-08-28 MED ORDER — LIDOCAINE 5 % EX PTCH: 1.0000 | MEDICATED_PATCH | Freq: Two times a day (BID) | CUTANEOUS | 0 refills | Status: AC

## 2020-08-28 NOTE — ED Notes (Addendum)
No answer when called from lobby, pt reportedly sitting outside in vehicle 

## 2020-08-28 NOTE — ED Notes (Signed)
Pt unable to sign for d/c due to broken computer. Pt verbalized d/c instructions with no further questions at this time.

## 2020-08-28 NOTE — Discharge Instructions (Signed)
Like we talked about, you likely have a cold or other viral syndrome.  You tested negative for COVID-19.  Please take Tylenol and ibuprofen/Advil for your pain.  It is safe to take them together, or to alternate them every few hours.  Take up to 1000mg  of Tylenol at a time, up to 4 times per day.  Do not take more than 4000 mg of Tylenol in 24 hours.  For ibuprofen, take 400-600 mg, 4-5 times per day.  You are being discharged with a prescription for lidocaine patches to use as needed for your chronic back pain. As we discussed, apply 1 patch per day.  Leave it for 12 hours and then must be off for 12 hours before applying the next 1. 12 hours on, 12 hours off.  Return to the ED with any worsening symptoms, particularly with high fevers, passing out or chest pain.
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
## 2021-06-23 ENCOUNTER — Other Ambulatory Visit: Payer: Self-pay

## 2021-06-23 ENCOUNTER — Emergency Department: Payer: Self-pay

## 2021-06-23 ENCOUNTER — Emergency Department
Admission: EM | Admit: 2021-06-23 | Discharge: 2021-06-23 | Disposition: A | Discharge status: Home / Self Care | Payer: Self-pay | Attending: Emergency Medicine | Admitting: Emergency Medicine

## 2021-06-23 DIAGNOSIS — R0789 Other chest pain: Secondary | ICD-10-CM | POA: Insufficient documentation

## 2021-06-23 DIAGNOSIS — R079 Chest pain, unspecified: Secondary | ICD-10-CM

## 2021-06-23 DIAGNOSIS — Z87891 Personal history of nicotine dependence: Secondary | ICD-10-CM | POA: Insufficient documentation

## 2021-06-23 LAB — CBC
HCT: 46.9 % (ref 39.0–52.0)
Hemoglobin: 16.1 g/dL (ref 13.0–17.0)
MCH: 27.6 pg (ref 26.0–34.0)
MCHC: 34.3 g/dL (ref 30.0–36.0)
MCV: 80.4 fL (ref 80.0–100.0)
Platelets: 243 10*3/uL (ref 150–400)
RBC: 5.83 MIL/uL — ABNORMAL HIGH (ref 4.22–5.81)
RDW: 13.1 % (ref 11.5–15.5)
WBC: 8.4 10*3/uL (ref 4.0–10.5)
nRBC: 0 % (ref 0.0–0.2)

## 2021-06-23 LAB — BASIC METABOLIC PANEL
Anion gap: 10 (ref 5–15)
BUN: 15 mg/dL (ref 6–20)
CO2: 27 mmol/L (ref 22–32)
Calcium: 9.2 mg/dL (ref 8.9–10.3)
Chloride: 102 mmol/L (ref 98–111)
Creatinine, Ser: 0.89 mg/dL (ref 0.61–1.24)
GFR, Estimated: >60 mL/min (ref >60)
Glucose, Bld: 97 mg/dL (ref 70–99)
Potassium: 3.9 mmol/L (ref 3.5–5.1)
Sodium: 139 mmol/L (ref 135–145)

## 2021-06-23 LAB — TROPONIN I (HIGH SENSITIVITY): Troponin I (High Sensitivity): 3 ng/L (ref <18)

## 2021-06-23 NOTE — ED Triage Notes (Signed)
Pt states that he has been having a steady pressure to the left side of his chest since last pm, states that he has never had this before, states that he was sweating more than usual last pm as well, states that the pain is there regardless of movement

## 2021-06-23 NOTE — Discharge Instructions (Signed)
Your blood work, EKG and chest x-ray were all reassuring today.  We do not know exactly what is causing your chest pain, could be due to muscle strain or acid reflux potentially.  You can take NSAIDs for the pain.  If your pain does not improve, please follow-up with your primary care provider.

## 2021-06-23 NOTE — ED Provider Notes (Signed)
[facility]  ____________________________________________   Event Date/Time   First MD Initiated Contact with Patient 06/23/21 1016     (approximate)  I have reviewed the triage vital signs and the nursing notes.   HISTORY  Chief Complaint Chest Pain    HPI Jackson Jimenez is a 30 y.o. male with no significant past medical history presents with chest pain.  Symptoms started yesterday.  He endorses a pressure-like sensation in the left side of his chest that has been constant since onset.  It is not worse with respiration or exertion.  Nothing makes it better.  He denies associated dyspnea cough congestion fevers chills nausea or vomiting.  No lower extremity swelling.The patient denies hx of prior DVT/PE, unilateral leg pain/swelling, hormone use, recent surgery, hx of cancer, or hemoptysis.  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         No past medical history on file.  There are no problems to display for this patient.   No past surgical history on file.  Prior to Admission medications   Medication Sig Start Date End Date Taking? Authorizing Provider  brompheniramine-pseudoephedrine-DM 30-2-10 MG/5ML syrup Take 5 mLs by mouth 4 (four) times daily as needed. 06/08/18   Tommi Rumps, PA-C  cyclobenzaprine (FLEXERIL) 10 MG tablet Take 0.5 tablets (5 mg total) by mouth 3 (three) times daily as needed for muscle spasms. 05/22/20   Triplett, Rulon Eisenmenger B, FNP  diphenhydrAMINE (BENADRYL) 25 mg capsule Take 1 capsule (25 mg total) by mouth every 4 (four) hours as needed. 09/06/16 09/16/16  Orvil Feil, PA-C  lidocaine (LIDODERM) 5 % Place 1 patch onto the skin every 12 (twelve) hours. Remove & Discard patch within 12 hours or as directed by MD 08/28/20 08/28/21  Delton Prairie, MD  ranitidine (ZANTAC) 300 MG tablet Take 1 tablet (300 mg total) by mouth at bedtime. 09/06/16 09/06/17  Orvil Feil, PA-C    Allergies Albuterol  No family history  on file.  Social History Social History   Tobacco Use   Smoking status: Former    Types: Cigarettes   Smokeless tobacco: Never  Vaping Use   Vaping Use: Never used  Substance Use Topics   Alcohol use: Not Currently   Drug use: Not Currently    Review of Systems   Review of Systems  Constitutional:  Negative for chills and fever.  Respiratory:  Positive for chest tightness. Negative for shortness of breath.   Cardiovascular:  Positive for chest pain. Negative for palpitations and leg swelling.  Gastrointestinal:  Negative for abdominal pain, nausea and vomiting.  All other systems reviewed and are negative.  Physical Exam Updated Vital Signs BP (!) 132/98 (BP Location: Right Arm)   Pulse 67   Temp 98 F (36.7 C) (Oral)   Resp 16   Ht 6' (1.829 m)   Wt 129.3 kg   SpO2 96%   BMI 38.65 kg/m   Physical Exam Vitals and nursing note reviewed.  Constitutional:      General: He is not in acute distress.    Appearance: Normal appearance.  HENT:     Head: Normocephalic and atraumatic.  Eyes:     General: No scleral icterus.    Conjunctiva/sclera: Conjunctivae normal.  Cardiovascular:     Heart sounds: Normal heart sounds.  Pulmonary:     Effort: Pulmonary effort is normal. No respiratory distress.     Breath sounds: Normal breath sounds. No wheezing.  Musculoskeletal:  General: No deformity or signs of injury.     Cervical back: Normal range of motion.     Right lower leg: No edema.     Left lower leg: No edema.  Skin:    Coloration: Skin is not jaundiced or pale.  Neurological:     General: No focal deficit present.     Mental Status: He is alert and oriented to person, place, and time. Mental status is at baseline.  Psychiatric:        Mood and Affect: Mood normal.        Behavior: Behavior normal.     LABS (all labs ordered are listed, but only abnormal results are displayed)  Labs Reviewed  CBC - Abnormal; Notable for the following components:       Result Value   RBC 5.83 (*)    All other components within normal limits  BASIC METABOLIC PANEL  TROPONIN I (HIGH SENSITIVITY)  TROPONIN I (HIGH SENSITIVITY)   ____________________________________________  EKG  NSR, nml axis, nml intervals, no acute ischemic changes  ____________________________________________  RADIOLOGY Ky Barban, personally viewed and evaluated these images (plain radiographs) as part of my medical decision making, as well as reviewing the written report by the radiologist.  ED MD interpretation:  I reviewed the CXR which does not show any acute cardiopulmonary process      ____________________________________________   PROCEDURES  Procedure(s) performed (including Critical Care):  Procedures   ____________________________________________   INITIAL IMPRESSION / ASSESSMENT AND PLAN / ED COURSE     Patient is a 30 year old healthy male who presents with 2 days of chest pain.  He has minimal associated symptoms.  His vital signs are within normal limits.  EKG does not show any acute ischemic changes.  Labs obtained from triage show a negative troponin.  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  Very low suspicion for ACS given his negative troponin and no risk factors.  Patient is stable for discharge.      ____________________________________________   FINAL CLINICAL IMPRESSION(S) / ED DIAGNOSES  Final diagnoses:  Chest pain, unspecified type     ED Discharge Orders     None        Note:  This document was prepared using Dragon voice recognition software and may include unintentional dictation errors.    [name], MD 06/23/21 [phone]

## 2021-08-25 ENCOUNTER — Emergency Department
Admission: EM | Admit: 2021-08-25 | Discharge: 2021-08-25 | Disposition: A | Discharge status: Home / Self Care | Payer: Self-pay | Attending: Emergency Medicine | Admitting: Emergency Medicine

## 2021-08-25 ENCOUNTER — Other Ambulatory Visit: Payer: Self-pay

## 2021-08-25 ENCOUNTER — Emergency Department: Payer: Self-pay

## 2021-08-25 DIAGNOSIS — T148XXA Other injury of unspecified body region, initial encounter: Secondary | ICD-10-CM

## 2021-08-25 DIAGNOSIS — Y9389 Activity, other specified: Secondary | ICD-10-CM | POA: Insufficient documentation

## 2021-08-25 DIAGNOSIS — W228XXA Striking against or struck by other objects, initial encounter: Secondary | ICD-10-CM | POA: Insufficient documentation

## 2021-08-25 DIAGNOSIS — S29011A Strain of muscle and tendon of front wall of thorax, initial encounter: Secondary | ICD-10-CM | POA: Insufficient documentation

## 2021-08-25 DIAGNOSIS — M549 Dorsalgia, unspecified: Secondary | ICD-10-CM | POA: Insufficient documentation

## 2021-08-25 DIAGNOSIS — R0789 Other chest pain: Secondary | ICD-10-CM

## 2021-08-25 DIAGNOSIS — Z87891 Personal history of nicotine dependence: Secondary | ICD-10-CM | POA: Insufficient documentation

## 2021-08-25 MED ORDER — MELOXICAM 15 MG PO TABS: 15.0000 mg | ORAL_TABLET | Freq: Every day | ORAL | 2 refills | Status: DC

## 2021-08-25 MED ORDER — KETOROLAC TROMETHAMINE 30 MG/ML IJ SOLN
30.0000 mg | Freq: Once | INTRAMUSCULAR | Status: AC
  Administered 2021-08-25: 12:00:00 30 mg via INTRAMUSCULAR
  Filled 2021-08-25: qty 1

## 2021-08-25 MED ORDER — BACLOFEN 10 MG PO TABS: 10.0000 mg | ORAL_TABLET | Freq: Three times a day (TID) | ORAL | 0 refills | Status: AC

## 2021-08-25 NOTE — ED Notes (Signed)
See triage note. Pt was moving small speaker to upper shelf, reaching R arm up, and was suddenly unable to move R arm up or down and had extreme pain on R side, radiating to front rib area and R back. Pain is now intermittent but becomes worse with movement. 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 Pain "takes his breath away" when it hits. Pt unable to describe quality of pain. Pt appears tachypneic, denies SOB or air hunger, states this is only from the pain.

## 2021-08-25 NOTE — ED Triage Notes (Signed)
Pt to ED for right sided back pain radiating under right arm pit. Reports 2 days ago was lifting a speaker and got hit right arm stuck and started having pain after that. Reports worsening pain with movement.

## 2021-08-25 NOTE — Discharge Instructions (Signed)
Apply ice to the area that hurts Take medication as prescribed Return emergency department worsening

## 2021-08-25 NOTE — ED Provider Notes (Signed)
[facility] Emergency Department Provider Note  ____________________________________________   Event Date/Time   First MD Initiated Contact with Patient 08/25/21 [phone]     (approximate)  I have reviewed the triage vital signs and the nursing notes.   HISTORY  Chief Complaint Back Pain    HPI Jackson Jimenez is a 30 y.o. male presents emergency department complaining of right sided chest pain.  States he was moving boxes and felt a sharp pain in the right side.  Pain radiates to the front.  Did have some difficulty breathing.  No fever or chills.  No cardiac type chest pain  History reviewed. No pertinent past medical history.  There are no problems to display for this patient.   History reviewed. No pertinent surgical history.  Prior to Admission medications   Medication Sig Start Date End Date Taking? Authorizing Provider  baclofen (LIORESAL) 10 MG tablet Take 1 tablet (10 mg total) by mouth 3 (three) times daily for 7 days. 08/25/21 09/01/21 Yes Omer Puccinelli, Roselyn Bering, PA-C  meloxicam (MOBIC) 15 MG tablet Take 1 tablet (15 mg total) by mouth daily. 08/25/21 08/25/22 Yes Darvis Croft, Roselyn Bering, PA-C  brompheniramine-pseudoephedrine-DM 30-2-10 MG/5ML syrup Take 5 mLs by mouth 4 (four) times daily as needed. 06/08/18   Tommi Rumps, PA-C  cyclobenzaprine (FLEXERIL) 10 MG tablet Take 0.5 tablets (5 mg total) by mouth 3 (three) times daily as needed for muscle spasms. 05/22/20   Triplett, Rulon Eisenmenger B, FNP  diphenhydrAMINE (BENADRYL) 25 mg capsule Take 1 capsule (25 mg total) by mouth every 4 (four) hours as needed. 09/06/16 09/16/16  Orvil Feil, PA-C  lidocaine (LIDODERM) 5 % Place 1 patch onto the skin every 12 (twelve) hours. Remove & Discard patch within 12 hours or as directed by MD 08/28/20 08/28/21  Delton Prairie, MD  ranitidine (ZANTAC) 300 MG tablet Take 1 tablet (300 mg total) by mouth at bedtime. 09/06/16 09/06/17  Orvil Feil, PA-C     Allergies Albuterol  No family history on file.  Social History Social History   Tobacco Use   Smoking status: Former    Types: Cigarettes   Smokeless tobacco: Never  Vaping Use   Vaping Use: Never used  Substance Use Topics   Alcohol use: Not Currently   Drug use: Not Currently    Review of Systems  Constitutional: No fever/chills Eyes: No visual changes. ENT: No sore throat. Respiratory: Denies cough Cardiovascular: Denies chest pain Gastrointestinal: Denies abdominal pain Genitourinary: Negative for dysuria. Musculoskeletal: Negative for back pain. Skin: Negative for rash. Psychiatric: no mood changes,     ____________________________________________   PHYSICAL EXAM:  VITAL SIGNS: ED Triage Vitals  Enc Vitals Group     BP 08/25/21 0925 (!) 126/95     Pulse Rate 08/25/21 0925 78     Resp 08/25/21 0925 20     Temp 08/25/21 0925 97.6 F (36.4 C)     Temp Source 08/25/21 0925 Oral     SpO2 08/25/21 0925 97 %     Weight 08/25/21 0932 270 lb (122.5 kg)     Height 08/25/21 0932 6' (1.829 m)     Head Circumference --      Peak Flow --      Pain Score 08/25/21 0931 10     Pain Loc --      Pain Edu? --      Excl. in GC? --     Constitutional: Alert and oriented. Well appearing and in  no acute distress. Eyes: Conjunctivae are normal.  Head: Atraumatic. Nose: No congestion/rhinnorhea. Mouth/Throat: Mucous membranes are moist.   Neck:  supple no lymphadenopathy noted Cardiovascular: Normal rate, regular rhythm. Heart sounds are normal Respiratory: Normal respiratory effort.  No retractions, lungs c t a, right anterior chest is tender to palpation Abd: soft nontender bs normal all 4 quad GU: deferred Musculoskeletal: FROM all extremities, warm and well perfused Neurologic:  Normal speech and language.  Skin:  Skin is warm, dry and intact. No rash noted. Psychiatric: Mood and affect are normal. Speech and behavior are  normal.  ____________________________________________   LABS (all labs ordered are listed, but only abnormal results are displayed)  Labs Reviewed - No data to display ____________________________________________   ____________________________________________  RADIOLOGY  Chest x-ray  ____________________________________________   PROCEDURES  Procedure(s) performed: No  Procedures    ____________________________________________   INITIAL IMPRESSION / ASSESSMENT AND PLAN / ED COURSE  Pertinent labs & imaging results that were available during my care of the patient were reviewed by me and considered in my medical decision making (see chart for details).   The patient is a 30 year old male presents emergency department with concerns of right-sided chest pain.  See HPI.  Physical exam shows patient be stable.  The area is tender to palpation, however the patient does appear to be have difficulty breathing during my initial exam.  We will do a chest x-ray to ensure there is no pneumothorax.  Chest x-ray reviewed by me confirmed by radiology to be negative for any acute abnormality  I did explain the findings to the patient.  He was given a Toradol injection while here in the ED.  Prescription for meloxicam and baclofen.  Follow-up with his regular doctor if not improving in 3 to 4 days.  Return emergency department if worsening.  Apply ice to all areas that hurt.  Discharged in stable condition.     Jackson Jimenez was evaluated in Emergency Department on 08/25/2021 for the symptoms described in the history of present illness. He was evaluated in the context of the global COVID-19 pandemic, which necessitated consideration that the patient might be at risk for infection with the SARS-CoV-2 virus that causes COVID-19. Institutional protocols and algorithms that pertain to the evaluation of patients at risk for COVID-19 are in a state of rapid change based on information released  by regulatory bodies including the CDC and federal and state organizations. These policies and algorithms were followed during the patient's care in the ED.    As part of my medical decision making, I reviewed the following data within the electronic MEDICAL RECORD NUMBER Nursing notes reviewed and incorporated, Old chart reviewed, Radiograph reviewed , Notes from prior ED visits, and [place] Controlled Substance Database  ____________________________________________   FINAL CLINICAL IMPRESSION(S) / ED DIAGNOSES  Final diagnoses:  Acute chest wall pain  Muscle strain      NEW MEDICATIONS STARTED DURING THIS VISIT:  New Prescriptions   BACLOFEN (LIORESAL) 10 MG TABLET    Take 1 tablet (10 mg total) by mouth 3 (three) times daily for 7 days.   MELOXICAM (MOBIC) 15 MG TABLET    Take 1 tablet (15 mg total) by mouth daily.     Note:  This document was prepared using Dragon voice recognition software and may include unintentional dictation errors.    Faythe Ghee, PA-C 08/25/21 1144    Minna Antis, MD 08/25/21 1354

## 2021-09-09 IMAGING — CR DG CHEST 2V
2 series · 2 of 2 positions shown · non-contrast
Comparison: 01/20/2018

CLINICAL DATA: Dyspnea with cough

EXAM:
CHEST - 2 VIEW

[chest pa]
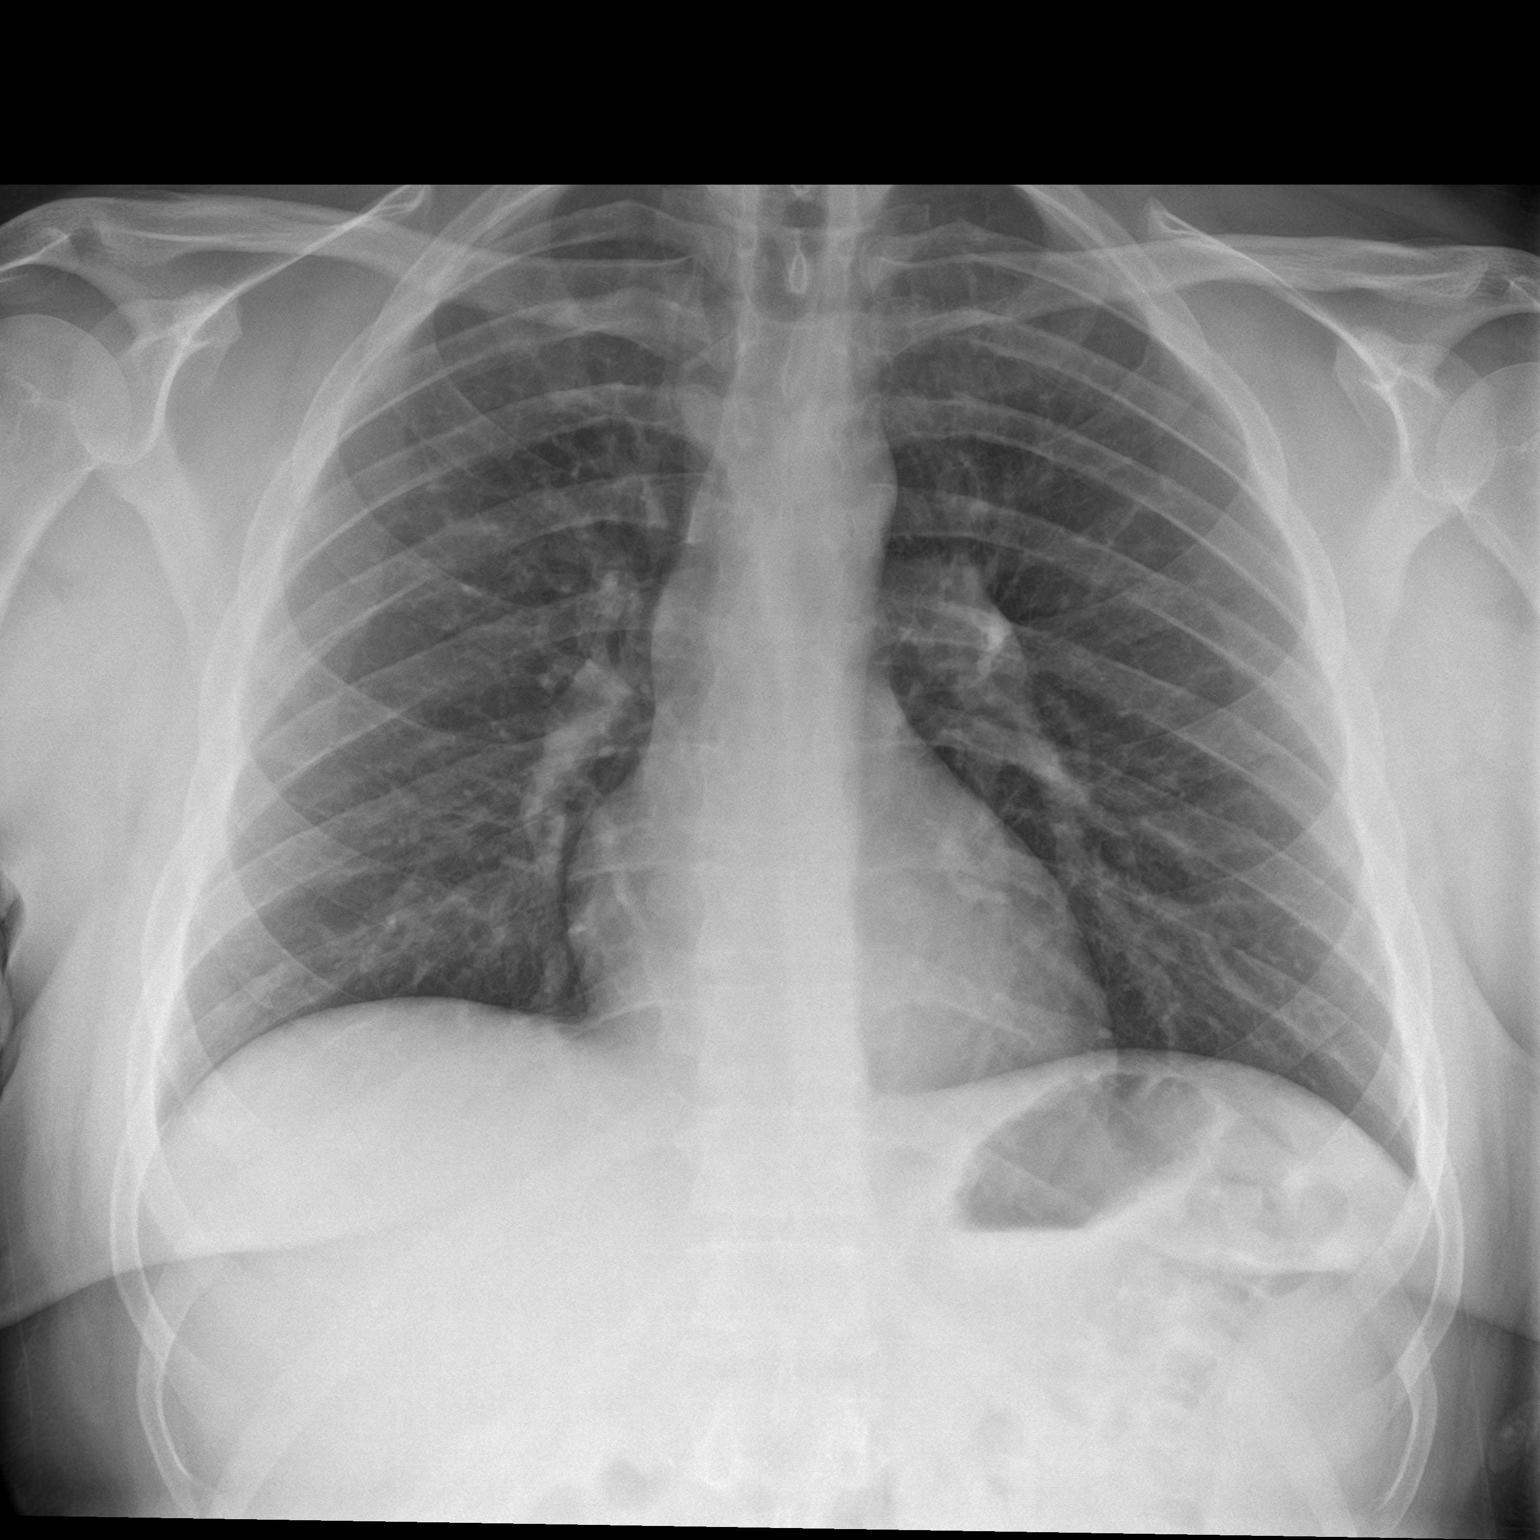

[chest lat]
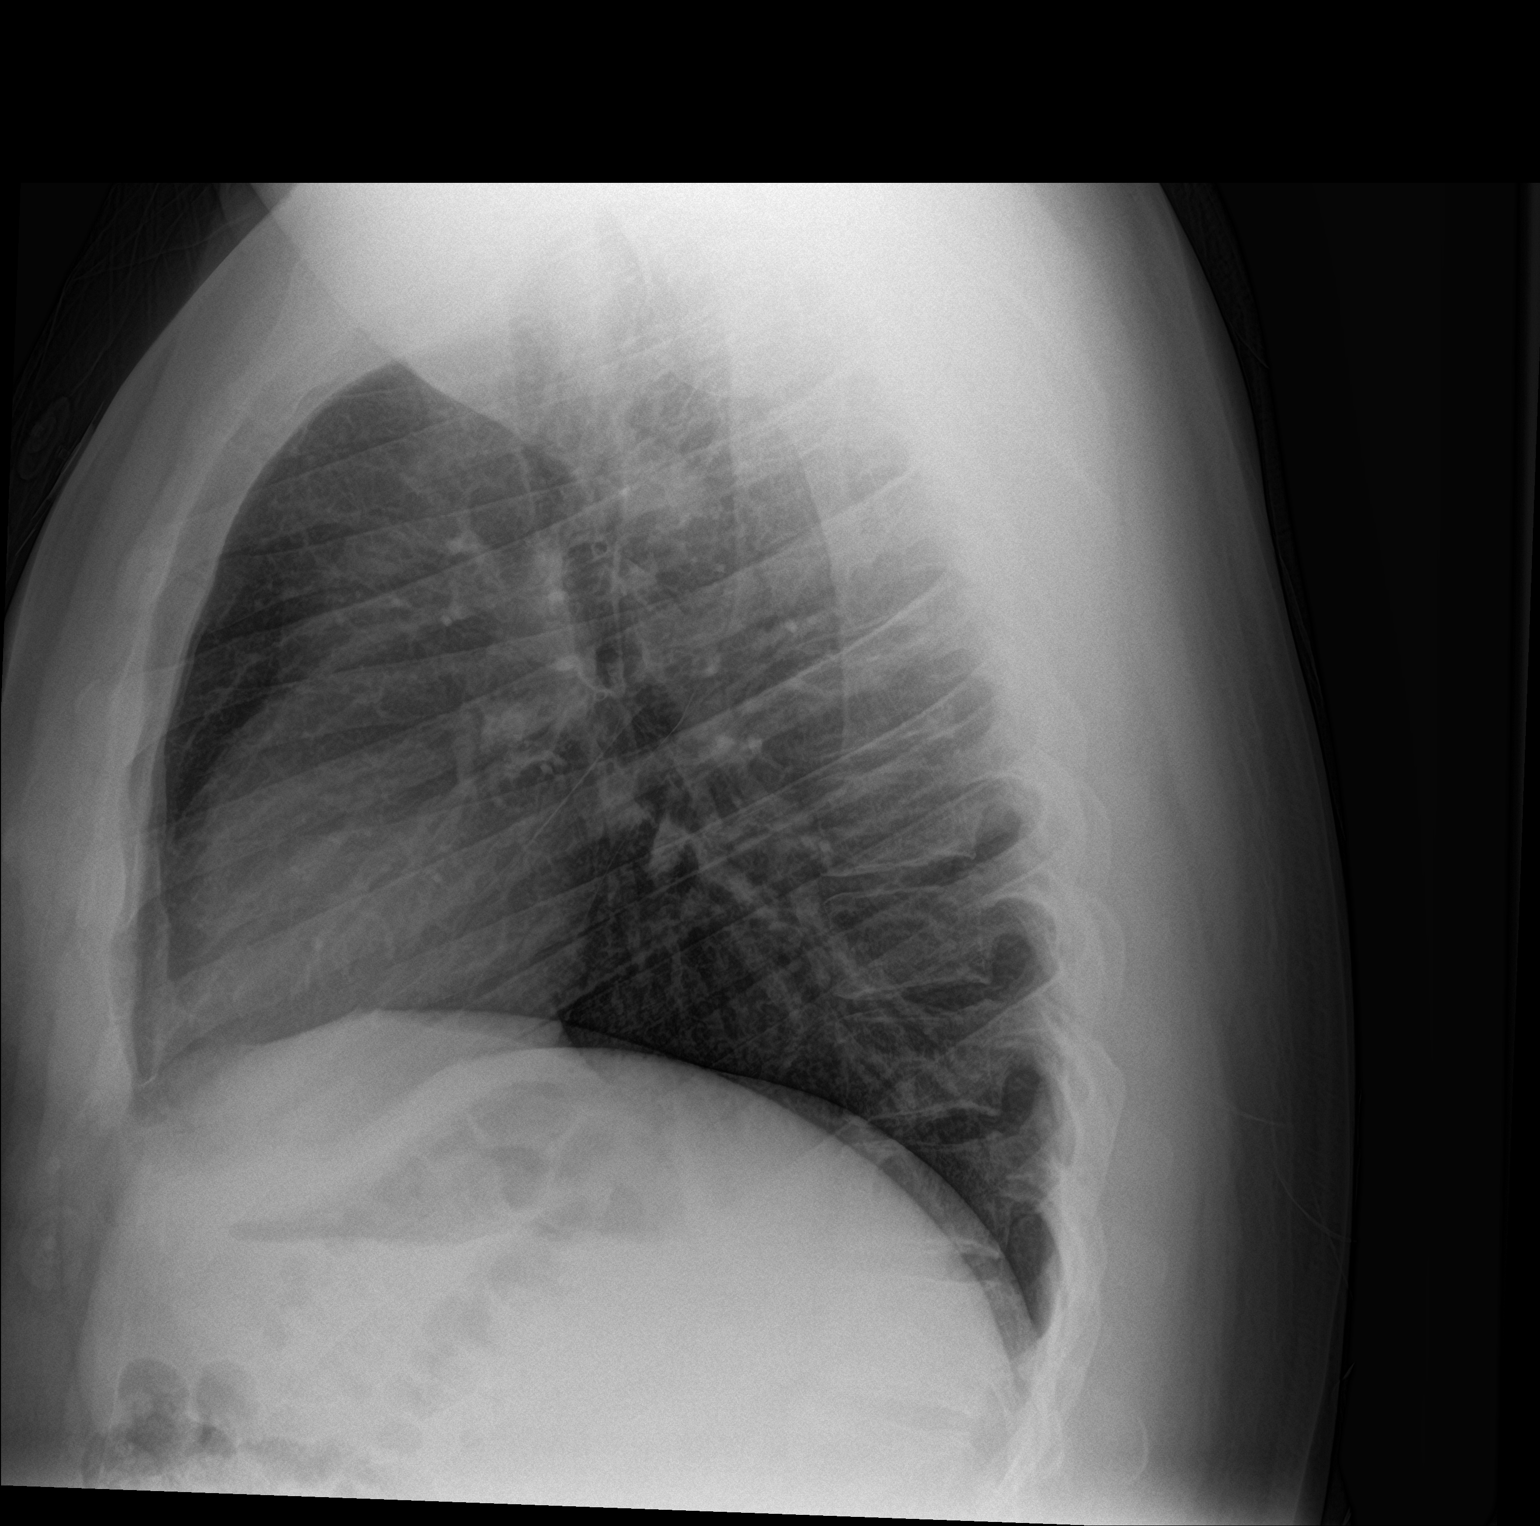

[2 of 2 positions shown; findings below may reference images not displayed]

FINDINGS: The heart size and mediastinal contours are within normal limits.
Both lungs are clear. The visualized skeletal structures are
unremarkable.
IMPRESSION: No active cardiopulmonary disease.

## 2022-02-12 ENCOUNTER — Other Ambulatory Visit: Payer: Self-pay

## 2022-02-12 ENCOUNTER — Emergency Department
Admission: EM | Admit: 2022-02-12 | Discharge: 2022-02-12 | Disposition: A | Discharge status: Home / Self Care | Payer: Self-pay | Attending: Student in an Organized Health Care Education/Training Program | Admitting: Student in an Organized Health Care Education/Training Program

## 2022-02-12 ENCOUNTER — Encounter: Payer: Self-pay | Admitting: Emergency Medicine

## 2022-02-12 DIAGNOSIS — M549 Dorsalgia, unspecified: Secondary | ICD-10-CM | POA: Insufficient documentation

## 2022-02-12 DIAGNOSIS — R0781 Pleurodynia: Secondary | ICD-10-CM | POA: Insufficient documentation

## 2022-02-12 DIAGNOSIS — R112 Nausea with vomiting, unspecified: Secondary | ICD-10-CM | POA: Insufficient documentation

## 2022-02-12 DIAGNOSIS — R21 Rash and other nonspecific skin eruption: Secondary | ICD-10-CM | POA: Insufficient documentation

## 2022-02-12 LAB — CBC WITH DIFFERENTIAL/PLATELET
Abs Immature Granulocytes: 0.07 K/uL (ref 0.00–0.07)
Basophils Absolute: 0.1 K/uL (ref 0.0–0.1)
Basophils Relative: 1 %
Eosinophils Absolute: 0.2 K/uL (ref 0.0–0.5)
Eosinophils Relative: 2 %
HCT: 45 % (ref 39.0–52.0)
Hemoglobin: 14.9 g/dL (ref 13.0–17.0)
Immature Granulocytes: 1 %
Lymphocytes Relative: 24 %
Lymphs Abs: 2.4 K/uL (ref 0.7–4.0)
MCH: 27.4 pg (ref 26.0–34.0)
MCHC: 33.1 g/dL (ref 30.0–36.0)
MCV: 82.7 fL (ref 80.0–100.0)
Monocytes Absolute: 0.7 K/uL (ref 0.1–1.0)
Monocytes Relative: 7 %
Neutro Abs: 6.4 K/uL (ref 1.7–7.7)
Neutrophils Relative %: 65 %
Platelets: 232 K/uL (ref 150–400)
RBC: 5.44 MIL/uL (ref 4.22–5.81)
RDW: 13.2 % (ref 11.5–15.5)
WBC: 9.8 K/uL (ref 4.0–10.5)
nRBC: 0 % (ref 0.0–0.2)

## 2022-02-12 LAB — URINALYSIS, ROUTINE W REFLEX MICROSCOPIC
Bilirubin Urine: NEGATIVE
Glucose, UA: NEGATIVE mg/dL
Hgb urine dipstick: NEGATIVE
Ketones, ur: NEGATIVE mg/dL
Leukocytes,Ua: NEGATIVE
Nitrite: NEGATIVE
Protein, ur: NEGATIVE mg/dL
Specific Gravity, Urine: 1.02 (ref 1.005–1.030)
pH: 7 (ref 5.0–8.0)

## 2022-02-12 LAB — COMPREHENSIVE METABOLIC PANEL WITH GFR
ALT: 49 U/L — ABNORMAL HIGH (ref 0–44)
AST: 32 U/L (ref 15–41)
Albumin: 4.3 g/dL (ref 3.5–5.0)
Alkaline Phosphatase: 81 U/L (ref 38–126)
Anion gap: 6 (ref 5–15)
BUN: 15 mg/dL (ref 6–20)
CO2: 30 mmol/L (ref 22–32)
Calcium: 9.2 mg/dL (ref 8.9–10.3)
Chloride: 103 mmol/L (ref 98–111)
Creatinine, Ser: 0.9 mg/dL (ref 0.61–1.24)
GFR, Estimated: >60 mL/min
Glucose, Bld: 107 mg/dL — ABNORMAL HIGH (ref 70–99)
Potassium: 3.9 mmol/L (ref 3.5–5.1)
Sodium: 139 mmol/L (ref 135–145)
Total Bilirubin: 0.5 mg/dL (ref 0.3–1.2)
Total Protein: 7.9 g/dL (ref 6.5–8.1)

## 2022-02-12 LAB — LIPASE, BLOOD: Lipase: 27 U/L (ref 11–51)

## 2022-02-12 MED ORDER — ONDANSETRON 4 MG PO TBDP: 4.0000 mg | ORAL_TABLET | Freq: Three times a day (TID) | ORAL | 0 refills | Status: DC | PRN

## 2022-02-12 NOTE — ED Triage Notes (Signed)
  Patient comes in with N/V that has been going on for about 6 hours.  Last meal was [employer] around 1500 yesterday afternoon.  Patient states he has thrown up several times. No diarrhea.  Patient also states he is having back pain near his ribs.  Took peptobismol around 0330.  Pain 4/10.

## 2022-02-12 NOTE — ED Provider Notes (Signed)
[name] Provider Note    Event Date/Time   First MD Initiated Contact with Patient 02/12/22 [phone]     (approximate)   History   Emesis and Back Pain   HPI  Jackson Jimenez is a 31 y.o. male presents emergency department with nausea vomiting for about 6 hours.  States he ate [employer] about 4 hours later started vomiting.  No diarrhea.  Had 2 solid bowel movements yesterday.  Having some pain in his ribs and states he has a rash around his eyes after vomiting.  Took Pepto-Bismol with some relief.  States he has had no more vomiting since he has been here in the ED.      Physical Exam   Triage Vital Signs: ED Triage Vitals  Enc Vitals Group     BP 02/12/22 0526 (!) 142/101     Pulse Rate 02/12/22 0526 68     Resp 02/12/22 0526 18     Temp 02/12/22 0526 97.6 F (36.4 C)     Temp Source 02/12/22 0526 Oral     SpO2 02/12/22 0526 97 %     Weight 02/12/22 0526 293 lb (132.9 kg)     Height 02/12/22 0526 6' (1.829 m)     Head Circumference --      Peak Flow --      Pain Score 02/12/22 0533 4     Pain Loc --      Pain Edu? --      Excl. in GC? --     Most recent vital signs: Vitals:   02/12/22 0526  BP: (!) 142/101  Pulse: 68  Resp: 18  Temp: 97.6 F (36.4 C)  SpO2: 97%     General: Awake, no distress.   CV:  Good peripheral perfusion. regular rate and  rhythm Resp:  Normal effort.  Abd:  No distention.  Nontender Other:  Small petechiae noted on the area underneath the eyes   ED Results / Procedures / Treatments   Labs (all labs ordered are listed, but only abnormal results are displayed) Labs Reviewed  COMPREHENSIVE METABOLIC PANEL - Abnormal; Notable for the following components:      Result Value   Glucose, Bld 107 (*)    ALT 49 (*)    All other components within normal limits  URINALYSIS, ROUTINE W REFLEX MICROSCOPIC - Abnormal; Notable for the following components:   Color, Urine YELLOW (*)    APPearance CLEAR (*)     All other components within normal limits  CBC WITH DIFFERENTIAL/PLATELET  LIPASE, BLOOD     EKG     RADIOLOGY     PROCEDURES:   Procedures   MEDICATIONS ORDERED IN ED: Medications - No data to display   IMPRESSION / MDM / ASSESSMENT AND PLAN / ED COURSE  I reviewed the triage vital signs and the nursing notes.                              Differential diagnosis includes, but is not limited to, acute cholecystitis, viral gastroenteritis, food poisoning, pancreatitis  Patient's presentation is most consistent with acute illness / injury with system symptoms.   Labs are reassuring, CBC, metabolic panel and lipase are all normal  I did explain these findings to the patient.  He appears to be feeling better.  His have been vomiting here in the ED.  His abdomen is nontender.  I do  not feel that the patient needs further work-up or admission.  He is in agreement treatment plan.  He was given a prescription for Zofran ODT.  Discharged stable condition.      FINAL CLINICAL IMPRESSION(S) / ED DIAGNOSES   Final diagnoses:  Nausea and vomiting, unspecified vomiting type     Rx / DC Orders   ED Discharge Orders          Ordered    ondansetron (ZOFRAN-ODT) 4 MG disintegrating tablet  Every 8 hours PRN        02/12/22 0746             Note:  This document was prepared using Dragon voice recognition software and may include unintentional dictation errors.    Faythe Ghee, PA-C 02/12/22 0750    Willy Eddy, MD 02/12/22 [phone]

## 2022-05-03 ENCOUNTER — Other Ambulatory Visit: Payer: Self-pay

## 2022-05-03 ENCOUNTER — Observation Stay: Payer: Self-pay | Admitting: Anesthesiology

## 2022-05-03 ENCOUNTER — Encounter
Admission: EM | Disposition: A | Discharge status: Home / Self Care | Payer: Self-pay | Source: Home / Self Care | Attending: Internal Medicine

## 2022-05-03 ENCOUNTER — Emergency Department: Payer: Self-pay

## 2022-05-03 ENCOUNTER — Inpatient Hospital Stay
Admission: EM | Admit: 2022-05-03 | Discharge: 2022-05-05 | DRG: 418 | Disposition: A | Discharge status: Home / Self Care | Payer: Self-pay | Attending: Internal Medicine | Admitting: Internal Medicine

## 2022-05-03 DIAGNOSIS — Z6841 Body Mass Index (BMI) 40.0 and over, adult: Secondary | ICD-10-CM

## 2022-05-03 DIAGNOSIS — Z79899 Other long term (current) drug therapy: Secondary | ICD-10-CM

## 2022-05-03 DIAGNOSIS — Z888 Allergy status to other drugs, medicaments and biological substances status: Secondary | ICD-10-CM

## 2022-05-03 DIAGNOSIS — K8020 Calculus of gallbladder without cholecystitis without obstruction: Secondary | ICD-10-CM

## 2022-05-03 DIAGNOSIS — K8000 Calculus of gallbladder with acute cholecystitis without obstruction: Secondary | ICD-10-CM | POA: Diagnosis present

## 2022-05-03 DIAGNOSIS — E66813 Obesity, class 3: Secondary | ICD-10-CM | POA: Diagnosis present

## 2022-05-03 DIAGNOSIS — R109 Unspecified abdominal pain: Secondary | ICD-10-CM | POA: Diagnosis present

## 2022-05-03 DIAGNOSIS — R7401 Elevation of levels of liver transaminase levels: Secondary | ICD-10-CM | POA: Diagnosis present

## 2022-05-03 DIAGNOSIS — J45909 Unspecified asthma, uncomplicated: Secondary | ICD-10-CM | POA: Diagnosis present

## 2022-05-03 DIAGNOSIS — K8012 Calculus of gallbladder with acute and chronic cholecystitis without obstruction: Principal | ICD-10-CM | POA: Diagnosis present

## 2022-05-03 DIAGNOSIS — E876 Hypokalemia: Secondary | ICD-10-CM

## 2022-05-03 DIAGNOSIS — K82A1 Gangrene of gallbladder in cholecystitis: Secondary | ICD-10-CM | POA: Diagnosis present

## 2022-05-03 DIAGNOSIS — Z87891 Personal history of nicotine dependence: Secondary | ICD-10-CM

## 2022-05-03 DIAGNOSIS — Z5902 Unsheltered homelessness: Secondary | ICD-10-CM

## 2022-05-03 DIAGNOSIS — R1013 Epigastric pain: Principal | ICD-10-CM

## 2022-05-03 HISTORY — DX: Morbid (severe) obesity due to excess calories: E66.01

## 2022-05-03 LAB — CBC
HCT: 44.6 % (ref 39.0–52.0)
Hemoglobin: 14.9 g/dL (ref 13.0–17.0)
MCH: 27.5 pg (ref 26.0–34.0)
MCHC: 33.4 g/dL (ref 30.0–36.0)
MCV: 82.4 fL (ref 80.0–100.0)
Platelets: 255 10*3/uL (ref 150–400)
RBC: 5.41 MIL/uL (ref 4.22–5.81)
RDW: 12.9 % (ref 11.5–15.5)
WBC: 10 10*3/uL (ref 4.0–10.5)
nRBC: 0 % (ref 0.0–0.2)

## 2022-05-03 LAB — URINALYSIS, ROUTINE W REFLEX MICROSCOPIC
Bilirubin Urine: NEGATIVE
Glucose, UA: NEGATIVE mg/dL
Hgb urine dipstick: NEGATIVE
Ketones, ur: NEGATIVE mg/dL
Leukocytes,Ua: NEGATIVE
Nitrite: NEGATIVE
Protein, ur: 30 mg/dL — AB
Specific Gravity, Urine: 1.029 (ref 1.005–1.030)
pH: 6 (ref 5.0–8.0)

## 2022-05-03 LAB — HIV ANTIBODY (ROUTINE TESTING W REFLEX): HIV Screen 4th Generation wRfx: NONREACTIVE

## 2022-05-03 LAB — URINE DRUG SCREEN, QUALITATIVE (ARMC ONLY)
Amphetamines, Ur Screen: NOT DETECTED
Barbiturates, Ur Screen: NOT DETECTED
Benzodiazepine, Ur Scrn: NOT DETECTED
Cannabinoid 50 Ng, Ur ~~LOC~~: NOT DETECTED
Cocaine Metabolite,Ur ~~LOC~~: NOT DETECTED
MDMA (Ecstasy)Ur Screen: NOT DETECTED
Methadone Scn, Ur: NOT DETECTED
Opiate, Ur Screen: POSITIVE — AB
Phencyclidine (PCP) Ur S: NOT DETECTED
Tricyclic, Ur Screen: NOT DETECTED

## 2022-05-03 LAB — COMPREHENSIVE METABOLIC PANEL
ALT: 79 U/L — ABNORMAL HIGH (ref 0–44)
AST: 31 U/L (ref 15–41)
Albumin: 4.4 g/dL (ref 3.5–5.0)
Alkaline Phosphatase: 91 U/L (ref 38–126)
Anion gap: 9 (ref 5–15)
BUN: 14 mg/dL (ref 6–20)
CO2: 24 mmol/L (ref 22–32)
Calcium: 9 mg/dL (ref 8.9–10.3)
Chloride: 107 mmol/L (ref 98–111)
Creatinine, Ser: 0.87 mg/dL (ref 0.61–1.24)
GFR, Estimated: >60 mL/min (ref >60)
Glucose, Bld: 99 mg/dL (ref 70–99)
Potassium: 3.4 mmol/L — ABNORMAL LOW (ref 3.5–5.1)
Sodium: 140 mmol/L (ref 135–145)
Total Bilirubin: 0.7 mg/dL (ref 0.3–1.2)
Total Protein: 8 g/dL (ref 6.5–8.1)

## 2022-05-03 LAB — TROPONIN I (HIGH SENSITIVITY)
Troponin I (High Sensitivity): 3 ng/L (ref <18)
Troponin I (High Sensitivity): <2 ng/L (ref <18)

## 2022-05-03 LAB — LIPASE, BLOOD: Lipase: 32 U/L (ref 11–51)

## 2022-05-03 SURGERY — CHOLECYSTECTOMY, ROBOT-ASSISTED, LAPAROSCOPIC
Anesthesia: General | Site: Abdomen

## 2022-05-03 MED ORDER — FENTANYL CITRATE (PF) 100 MCG/2ML IJ SOLN
INTRAMUSCULAR | Status: DC | PRN
  Administered 2022-05-03: 100 ug via INTRAVENOUS

## 2022-05-03 MED ORDER — SODIUM CHLORIDE 0.9 % IV BOLUS
1000.0000 mL | Freq: Once | INTRAVENOUS | Status: AC
  Administered 2022-05-03: 1000 mL via INTRAVENOUS

## 2022-05-03 MED ORDER — CHLORHEXIDINE GLUCONATE CLOTH 2 % EX PADS
6.0000 | MEDICATED_PAD | Freq: Every day | CUTANEOUS | Status: DC
Start: 2022-05-03 — End: 2022-05-04
  Filled 2022-05-03: qty 6

## 2022-05-03 MED ORDER — HYDROCODONE-ACETAMINOPHEN 5-325 MG PO TABS
1.0000 | ORAL_TABLET | Freq: Four times a day (QID) | ORAL | Status: DC | PRN
  Filled 2022-05-03: qty 1

## 2022-05-03 MED ORDER — KETOROLAC TROMETHAMINE 30 MG/ML IJ SOLN
30.0000 mg | Freq: Four times a day (QID) | INTRAMUSCULAR | Status: DC
  Administered 2022-05-03 – 2022-05-05 (×7): 30 mg via INTRAVENOUS
  Filled 2022-05-03 (×7): qty 1

## 2022-05-03 MED ORDER — MORPHINE SULFATE (PF) 2 MG/ML IV SOLN
2.0000 mg | INTRAVENOUS | Status: DC | PRN
  Administered 2022-05-03: 2 mg via INTRAVENOUS
  Filled 2022-05-03: qty 1

## 2022-05-03 MED ORDER — FAMOTIDINE 20 MG PO TABS
20.0000 mg | ORAL_TABLET | Freq: Once | ORAL | Status: AC
  Administered 2022-05-03: 20 mg via ORAL
  Filled 2022-05-03: qty 1

## 2022-05-03 MED ORDER — ROCURONIUM BROMIDE 100 MG/10ML IV SOLN
INTRAVENOUS | Status: DC | PRN
  Administered 2022-05-03: 70 mg via INTRAVENOUS

## 2022-05-03 MED ORDER — KETAMINE HCL 50 MG/5ML IJ SOSY
PREFILLED_SYRINGE | INTRAMUSCULAR | Status: AC
  Filled 2022-05-03: qty 5

## 2022-05-03 MED ORDER — DROPERIDOL 2.5 MG/ML IJ SOLN: 0.6250 mg | Freq: Once | INTRAMUSCULAR | Status: DC | PRN

## 2022-05-03 MED ORDER — HYDROMORPHONE HCL 1 MG/ML IJ SOLN
INTRAMUSCULAR | Status: DC | PRN
  Administered 2022-05-03: 1 mg via INTRAVENOUS

## 2022-05-03 MED ORDER — 0.9 % SODIUM CHLORIDE (POUR BTL) OPTIME
TOPICAL | Status: DC | PRN
  Administered 2022-05-03: 200 mL

## 2022-05-03 MED ORDER — PANTOPRAZOLE SODIUM 40 MG PO TBEC
40.0000 mg | DELAYED_RELEASE_TABLET | Freq: Every day | ORAL | Status: DC
  Administered 2022-05-03 – 2022-05-05 (×3): 40 mg via ORAL
  Filled 2022-05-03 (×3): qty 1

## 2022-05-03 MED ORDER — OXYCODONE HCL 5 MG/5ML PO SOLN: 5.0000 mg | Freq: Once | ORAL | Status: DC | PRN

## 2022-05-03 MED ORDER — PHENYLEPHRINE HCL (PRESSORS) 10 MG/ML IV SOLN
INTRAVENOUS | Status: DC | PRN
  Administered 2022-05-03 (×4): 160 ug via INTRAVENOUS

## 2022-05-03 MED ORDER — IOHEXOL 350 MG/ML SOLN
100.0000 mL | Freq: Once | INTRAVENOUS | Status: AC | PRN
  Administered 2022-05-03: 100 mL via INTRAVENOUS

## 2022-05-03 MED ORDER — FENTANYL CITRATE (PF) 100 MCG/2ML IJ SOLN
25.0000 ug | INTRAMUSCULAR | Status: DC | PRN
  Administered 2022-05-03: 50 ug via INTRAVENOUS
  Administered 2022-05-03: 25 ug via INTRAVENOUS

## 2022-05-03 MED ORDER — LIDOCAINE HCL (CARDIAC) PF 100 MG/5ML IV SOSY
PREFILLED_SYRINGE | INTRAVENOUS | Status: DC | PRN
  Administered 2022-05-03: 100 mg via INTRAVENOUS

## 2022-05-03 MED ORDER — INDOCYANINE GREEN 25 MG IV SOLR
2.5000 mg | Freq: Once | INTRAVENOUS | Status: AC
  Administered 2022-05-03: 2.5 mg via INTRAVENOUS
  Filled 2022-05-03: qty 1

## 2022-05-03 MED ORDER — ONDANSETRON HCL 4 MG/2ML IJ SOLN
4.0000 mg | Freq: Once | INTRAMUSCULAR | Status: AC | PRN
  Administered 2022-05-03: 4 mg via INTRAVENOUS
  Filled 2022-05-03: qty 2

## 2022-05-03 MED ORDER — ACETAMINOPHEN 10 MG/ML IV SOLN
INTRAVENOUS | Status: AC
  Administered 2022-05-03: 1000 mg via INTRAVENOUS
  Filled 2022-05-03: qty 100

## 2022-05-03 MED ORDER — KETOROLAC TROMETHAMINE 30 MG/ML IJ SOLN
INTRAMUSCULAR | Status: DC | PRN
  Administered 2022-05-03: 30 mg via INTRAVENOUS

## 2022-05-03 MED ORDER — BUPIVACAINE-EPINEPHRINE (PF) 0.5% -1:200000 IJ SOLN
INTRAMUSCULAR | Status: AC
  Filled 2022-05-03: qty 30

## 2022-05-03 MED ORDER — SODIUM CHLORIDE 0.9% FLUSH
3.0000 mL | Freq: Two times a day (BID) | INTRAVENOUS | Status: DC
  Administered 2022-05-03 – 2022-05-05 (×4): 3 mL via INTRAVENOUS

## 2022-05-03 MED ORDER — ACETAMINOPHEN 10 MG/ML IV SOLN
1000.0000 mg | Freq: Once | INTRAVENOUS | Status: DC | PRN
Start: 2022-05-03 — End: 2022-05-03

## 2022-05-03 MED ORDER — FENTANYL CITRATE (PF) 100 MCG/2ML IJ SOLN
INTRAMUSCULAR | Status: AC
  Administered 2022-05-03: 25 ug via INTRAVENOUS
  Filled 2022-05-03: qty 2

## 2022-05-03 MED ORDER — LACTATED RINGERS IV SOLN: INTRAVENOUS | Status: DC | PRN

## 2022-05-03 MED ORDER — ONDANSETRON HCL 4 MG/2ML IJ SOLN
INTRAMUSCULAR | Status: DC | PRN
  Administered 2022-05-03: 4 mg via INTRAVENOUS

## 2022-05-03 MED ORDER — ONDANSETRON HCL 4 MG/2ML IJ SOLN
4.0000 mg | Freq: Four times a day (QID) | INTRAMUSCULAR | Status: DC | PRN
  Administered 2022-05-03: 4 mg via INTRAVENOUS
  Filled 2022-05-03: qty 2

## 2022-05-03 MED ORDER — PROPOFOL 10 MG/ML IV BOLUS
INTRAVENOUS | Status: DC | PRN
  Administered 2022-05-03: 200 mg via INTRAVENOUS

## 2022-05-03 MED ORDER — ACETAMINOPHEN 325 MG PO TABS: 650.0000 mg | ORAL_TABLET | Freq: Four times a day (QID) | ORAL | Status: DC | PRN

## 2022-05-03 MED ORDER — MIDAZOLAM HCL 2 MG/2ML IJ SOLN
INTRAMUSCULAR | Status: AC
  Filled 2022-05-03: qty 2

## 2022-05-03 MED ORDER — ENOXAPARIN SODIUM 80 MG/0.8ML IJ SOSY
0.5000 mg/kg | PREFILLED_SYRINGE | INTRAMUSCULAR | Status: DC
  Administered 2022-05-03 – 2022-05-04 (×2): 70 mg via SUBCUTANEOUS
  Filled 2022-05-03 (×2): qty 0.7

## 2022-05-03 MED ORDER — HYDROMORPHONE HCL 1 MG/ML IJ SOLN
1.0000 mg | INTRAMUSCULAR | Status: AC
  Administered 2022-05-03: 1 mg via INTRAVENOUS
  Filled 2022-05-03: qty 1

## 2022-05-03 MED ORDER — HYDROMORPHONE HCL 1 MG/ML IJ SOLN
INTRAMUSCULAR | Status: AC
  Filled 2022-05-03: qty 1

## 2022-05-03 MED ORDER — BUPIVACAINE LIPOSOME 1.3 % IJ SUSP
INTRAMUSCULAR | Status: AC
  Filled 2022-05-03: qty 20

## 2022-05-03 MED ORDER — HYDROMORPHONE HCL 1 MG/ML IJ SOLN
0.5000 mg | INTRAMUSCULAR | Status: DC | PRN
  Administered 2022-05-03: 0.5 mg via INTRAVENOUS
  Filled 2022-05-03: qty 0.5

## 2022-05-03 MED ORDER — PROCHLORPERAZINE EDISYLATE 10 MG/2ML IJ SOLN
10.0000 mg | Freq: Four times a day (QID) | INTRAMUSCULAR | Status: DC | PRN
  Administered 2022-05-03: 10 mg via INTRAVENOUS
  Filled 2022-05-03: qty 2

## 2022-05-03 MED ORDER — ACETAMINOPHEN 500 MG PO TABS
1000.0000 mg | ORAL_TABLET | Freq: Four times a day (QID) | ORAL | Status: DC
  Administered 2022-05-04 – 2022-05-05 (×5): 1000 mg via ORAL
  Filled 2022-05-03 (×6): qty 2

## 2022-05-03 MED ORDER — DEXAMETHASONE SODIUM PHOSPHATE 10 MG/ML IJ SOLN
INTRAMUSCULAR | Status: DC | PRN
  Administered 2022-05-03: 10 mg via INTRAVENOUS

## 2022-05-03 MED ORDER — MORPHINE SULFATE (PF) 4 MG/ML IV SOLN
4.0000 mg | Freq: Once | INTRAVENOUS | Status: AC
  Administered 2022-05-03: 4 mg via INTRAVENOUS
  Filled 2022-05-03: qty 1

## 2022-05-03 MED ORDER — ORAL CARE MOUTH RINSE: 15.0000 mL | OROMUCOSAL | Status: DC | PRN

## 2022-05-03 MED ORDER — PROMETHAZINE HCL 25 MG/ML IJ SOLN: 6.2500 mg | INTRAMUSCULAR | Status: DC | PRN

## 2022-05-03 MED ORDER — ONDANSETRON HCL 4 MG PO TABS: 4.0000 mg | ORAL_TABLET | Freq: Four times a day (QID) | ORAL | Status: DC | PRN

## 2022-05-03 MED ORDER — FENTANYL CITRATE (PF) 100 MCG/2ML IJ SOLN
INTRAMUSCULAR | Status: AC
  Filled 2022-05-03: qty 2

## 2022-05-03 MED ORDER — POTASSIUM CHLORIDE CRYS ER 20 MEQ PO TBCR
40.0000 meq | EXTENDED_RELEASE_TABLET | Freq: Once | ORAL | Status: AC
  Administered 2022-05-03: 40 meq via ORAL
  Filled 2022-05-03: qty 2

## 2022-05-03 MED ORDER — OXYCODONE HCL 5 MG PO TABS: 5.0000 mg | ORAL_TABLET | Freq: Once | ORAL | Status: DC | PRN

## 2022-05-03 MED ORDER — KETOROLAC TROMETHAMINE 30 MG/ML IJ SOLN
30.0000 mg | Freq: Once | INTRAMUSCULAR | Status: AC
  Administered 2022-05-03: 30 mg via INTRAVENOUS
  Filled 2022-05-03: qty 1

## 2022-05-03 MED ORDER — MIDAZOLAM HCL 2 MG/2ML IJ SOLN
INTRAMUSCULAR | Status: DC | PRN
  Administered 2022-05-03: 2 mg via INTRAVENOUS

## 2022-05-03 MED ORDER — ALUM & MAG HYDROXIDE-SIMETH 200-200-20 MG/5ML PO SUSP
15.0000 mL | Freq: Once | ORAL | Status: AC
  Administered 2022-05-03: 15 mL via ORAL
  Filled 2022-05-03: qty 30

## 2022-05-03 MED ORDER — HYDROMORPHONE HCL 1 MG/ML IJ SOLN
0.5000 mg | INTRAMUSCULAR | Status: AC
  Administered 2022-05-03: 0.5 mg via INTRAVENOUS
  Filled 2022-05-03: qty 0.5

## 2022-05-03 MED ORDER — HYDRALAZINE HCL 20 MG/ML IJ SOLN: 10.0000 mg | INTRAMUSCULAR | Status: DC | PRN

## 2022-05-03 MED ORDER — OXYCODONE HCL 5 MG PO TABS
5.0000 mg | ORAL_TABLET | ORAL | Status: DC | PRN
  Administered 2022-05-04 – 2022-05-05 (×4): 5 mg via ORAL
  Filled 2022-05-03 (×4): qty 1

## 2022-05-03 MED ORDER — SODIUM CHLORIDE 0.9 % IV SOLN
2.0000 g | INTRAVENOUS | Status: DC
  Administered 2022-05-03 – 2022-05-04 (×2): 2 g via INTRAVENOUS
  Filled 2022-05-03 (×3): qty 20

## 2022-05-03 MED ORDER — BUPIVACAINE-EPINEPHRINE 0.5% -1:200000 IJ SOLN: INTRAMUSCULAR | Status: DC | PRN

## 2022-05-03 MED ORDER — SODIUM CHLORIDE 0.9 % IV SOLN: INTRAVENOUS | Status: DC

## 2022-05-03 MED ORDER — ACETAMINOPHEN 650 MG RE SUPP
650.0000 mg | Freq: Four times a day (QID) | RECTAL | Status: DC | PRN
Start: 2022-05-03 — End: 2022-05-03

## 2022-05-03 SURGICAL SUPPLY — 48 items
BULB IRRIG PATHFIND (MISCELLANEOUS) IMPLANT
CANNULA REDUC XI 12-8 STAPL (CANNULA) ×1
CANNULA REDUCER 12-8 DVNC XI (CANNULA) ×1 IMPLANT
CLIP LIGATING HEMO O LOK GREEN (MISCELLANEOUS) ×1 IMPLANT
DERMABOND ADVANCED (GAUZE/BANDAGES/DRESSINGS) ×1
DERMABOND ADVANCED .7 DNX12 (GAUZE/BANDAGES/DRESSINGS) ×1 IMPLANT
DRAIN CHANNEL JP 19F (MISCELLANEOUS) IMPLANT
DRAPE ARM DVNC X/XI (DISPOSABLE) ×4 IMPLANT
DRAPE COLUMN DVNC XI (DISPOSABLE) ×1 IMPLANT
DRAPE DA VINCI XI ARM (DISPOSABLE) ×4
DRAPE DA VINCI XI COLUMN (DISPOSABLE) ×1
DRSG TEGADERM 4X4.75 (GAUZE/BANDAGES/DRESSINGS) IMPLANT
ELECT CAUTERY BLADE 6.4 (BLADE) ×1 IMPLANT
ELECT REM PT RETURN 9FT ADLT (ELECTROSURGICAL) ×1
ELECTRODE REM PT RTRN 9FT ADLT (ELECTROSURGICAL) ×1 IMPLANT
GLOVE BIO SURGEON STRL SZ7 (GLOVE) ×2 IMPLANT
GOWN STRL REUS W/ TWL LRG LVL3 (GOWN DISPOSABLE) ×4 IMPLANT
GOWN STRL REUS W/TWL LRG LVL3 (GOWN DISPOSABLE) ×3
IRRIGATION STRYKERFLOW (MISCELLANEOUS) IMPLANT
IRRIGATOR STRYKERFLOW (MISCELLANEOUS) ×1
KIT PINK PAD W/HEAD ARE REST (MISCELLANEOUS) ×1 IMPLANT
KIT PINK PAD W/HEAD ARM REST (MISCELLANEOUS) ×1 IMPLANT
LABEL OR SOLS (LABEL) ×1 IMPLANT
MANIFOLD NEPTUNE II (INSTRUMENTS) ×1 IMPLANT
NEEDLE HYPO 22GX1.5 SAFETY (NEEDLE) ×1 IMPLANT
NS IRRIG 500ML POUR BTL (IV SOLUTION) ×1 IMPLANT
OBTURATOR OPTICAL STANDARD 8MM (TROCAR) ×1
OBTURATOR OPTICAL STND 8 DVNC (TROCAR) ×1
OBTURATOR OPTICALSTD 8 DVNC (TROCAR) ×1 IMPLANT
PACK LAP CHOLECYSTECTOMY (MISCELLANEOUS) ×1 IMPLANT
PENCIL SMOKE EVACUATOR (MISCELLANEOUS) ×1 IMPLANT
SEAL CANN UNIV 5-8 DVNC XI (MISCELLANEOUS) ×3 IMPLANT
SEAL XI 5MM-8MM UNIVERSAL (MISCELLANEOUS) ×3
SET TUBE SMOKE EVAC HIGH FLOW (TUBING) ×1 IMPLANT
SOLUTION ELECTROLUBE (MISCELLANEOUS) ×1 IMPLANT
SPONGE DRAIN TRACH 4X4 STRL 2S (GAUZE/BANDAGES/DRESSINGS) IMPLANT
SPONGE T-LAP 18X18 ~~LOC~~+RFID (SPONGE) ×1 IMPLANT
STAPLER CANNULA SEAL DVNC XI (STAPLE) ×1 IMPLANT
STAPLER CANNULA SEAL XI (STAPLE) ×1
SUT ETHILON 3-0 FS-10 30 BLK (SUTURE) ×1
SUT MNCRL AB 4-0 PS2 18 (SUTURE) ×1 IMPLANT
SUT VICRYL 0 AB UR-6 (SUTURE) ×2 IMPLANT
SUTURE EHLN 3-0 FS-10 30 BLK (SUTURE) IMPLANT
SYR 20ML LL LF (SYRINGE) ×1 IMPLANT
SYR 30ML LL (SYRINGE) ×1 IMPLANT
SYS BAG RETRIEVAL 10MM (BASKET) ×1
SYSTEM BAG RETRIEVAL 10MM (BASKET) ×1 IMPLANT
WATER STERILE IRR 3000ML UROMA (IV SOLUTION) IMPLANT

## 2022-05-03 NOTE — Op Note (Signed)
Robotic assisted laparoscopic Cholecystectomy  Pre-operative Diagnosis: cholecystitis  Post-operative Diagnosis: same  Procedure:  Robotic assisted laparoscopic Cholecystectomy  Surgeon: Sterling Big, MD FACS  Anesthesia: Gen. with endotracheal tube  Findings: Acute severe Cholecystitis w early gangrenous changes   Estimated Blood Loss: 20cc       Specimens: Gallbladder           Complications: none   Procedure Details  The patient was seen again in the Holding Room. The benefits, complications, treatment options, and expected outcomes were discussed with the patient. The risks of bleeding, infection, recurrence of symptoms, failure to resolve symptoms, bile duct damage, bile duct leak, retained common bile duct stone, bowel injury, any of which could require further surgery and/or ERCP, stent, or papillotomy were reviewed with the patient. The likelihood of improving the patient's symptoms with return to their baseline status is good.  The patient and/or family concurred with the proposed plan, giving informed consent.  The patient was taken to Operating Room, identified  and the procedure verified as Laparoscopic Cholecystectomy.  A Time Out was held and the above information confirmed.  Prior to the induction of general anesthesia, antibiotic prophylaxis was administered. VTE prophylaxis was in place. General endotracheal anesthesia was then administered and tolerated well. After the induction, the abdomen was prepped with Chloraprep and draped in the sterile fashion. The patient was positioned in the supine position.  Cut down technique was used to enter the abdominal cavity and a Hasson trochar was placed after two vicryl stitches were anchored to the fascia. Pneumoperitoneum was then created with CO2 and tolerated well without any adverse changes in the patient's vital signs.  Three 8-mm ports were placed under direct vision. All skin incisions  were infiltrated with a local  anesthetic agent before making the incision and placing the trocars.   The patient was positioned  in reverse Trendelenburg, robot was brought to the surgical field and docked in the standard fashion.  We made sure all the instrumentation was kept indirect view at all times and that there were no collision between the arms. I scrubbed out and went to the console.  The gallbladder was identified, the fundus grasped and retracted cephalad.It was severely inflamed and distended w early gangrenous changes. I needed to decompress it to allow a safe dissection. Stryker suction was used to decompress GB. Adhesions were lysed bluntly. The infundibulum was grasped and retracted laterally, exposing the peritoneum overlying the triangle of Calot. This was then divided and exposed in a blunt fashion. An extended critical view of the cystic duct and cystic artery was obtained.  The cystic duct was clearly identified and bluntly dissected.   Artery and duct were double clipped and divided. Using ICG cholangiography we visualize the cystic duct and  no evidence of bile injuries. The gallbladder was taken from the gallbladder fossa in a retrograde fashion with the electrocautery.  Hemostasis was achieved with the electrocautery. nspection of the right upper quadrant was performed. No bleeding, bile duct injury or leak, or bowel injury was noted. Robotic instruments and robotic arms were undocked in the standard fashion.  I scrubbed back in.  The gallbladder was removed and placed in an Endocatch bag.  19 blake drain placed GB fossa. Pneumoperitoneum was released.  The periumbilical port site was closed with interrumpted 0 Vicryl sutures. 4-0 subcuticular Monocryl was used to close the skin. Dermabond was  applied.  The patient was then extubated and brought to the recovery room in stable condition.  Sponge, lap, and needle counts were correct at closure and at the conclusion of the case.               Sterling Big,  MD, FACS

## 2022-05-03 NOTE — Anesthesia Preprocedure Evaluation (Addendum)
Anesthesia Evaluation  Patient identified by MRN, date of birth, ID band Patient awake    Reviewed: Allergy & Precautions, H&P , NPO status , Patient's Chart, lab work & pertinent test results  Airway Mallampati: III  TM Distance: >3 FB Neck ROM: full    Dental no notable dental hx.    Pulmonary neg pulmonary ROS, former smoker,    Pulmonary exam normal        Cardiovascular negative cardio ROS Normal cardiovascular exam     Neuro/Psych negative neurological ROS  negative psych ROS   GI/Hepatic Neg liver ROS, Acute Cholecycitis   Endo/Other  Morbid obesity  Renal/GU      Musculoskeletal   Abdominal (+) + obese,   Peds  Hematology negative hematology ROS (+)   Anesthesia Other Findings BMI    Body Mass Index: 41.23 kg/m      Reproductive/Obstetrics negative OB ROS                           Anesthesia Physical Anesthesia Plan  ASA: 3  Anesthesia Plan: General ETT   Post-op Pain Management: Regional block*, Toradol IV (intra-op)* and Ofirmev IV (intra-op)*   Induction: Rapid sequence and Intravenous  PONV Risk Score and Plan: 3 and Ondansetron, Dexamethasone, Midazolam and Droperidol  Airway Management Planned: Oral ETT  Additional Equipment:   Intra-op Plan:   Post-operative Plan: Extubation in OR  Informed Consent: I have reviewed the patients History and Physical, chart, labs and discussed the procedure including the risks, benefits and alternatives for the proposed anesthesia with the patient or authorized representative who has indicated his/her understanding and acceptance.     Dental advisory given  Plan Discussed with: CRNA and Anesthesiologist  Anesthesia Plan Comments:        Anesthesia Quick Evaluation

## 2022-05-03 NOTE — ED Notes (Signed)
Reports given to [facility] for OR

## 2022-05-03 NOTE — ED Provider Notes (Signed)
[facility] Provider Note    Event Date/Time   First MD Initiated Contact with Patient 05/03/22 [phone]     (approximate)   History   Abdominal Pain   HPI  Jackson Jimenez is a 31 y.o. male reports he has no significant past medical history.  He reports he is currently homeless and living in his vehicle.  He reports about 30 minutes ago he suddenly had a sudden severe pain in the middle of his upper abdomen.  Denies chest pain or trouble breathing.  Reports associated with vomiting several times.  Feels like he has severe pain.  Denies ever experienced anything like this in the past.  He denies history of alcohol use or previous issues such as pancreatitis.  Does have a history of asthma     Physical Exam   Triage Vital Signs: ED Triage Vitals  Enc Vitals Group     BP 05/03/22 0324 (!) 144/73     Pulse Rate 05/03/22 0324 68     Resp 05/03/22 0324 20     Temp 05/03/22 0324 98 F (36.7 C)     Temp Source 05/03/22 0324 Axillary     SpO2 05/03/22 0324 97 %     Weight --      Height --      Head Circumference --      Peak Flow --      Pain Score 05/03/22 0315 10     Pain Loc --      Pain Edu? --      Excl. in GC? --     Most recent vital signs: Vitals:   05/03/22 0509 05/03/22 0630  BP: (!) 140/91 (!) 142/92  Pulse: 63 62  Resp: 18 12  Temp:    SpO2: 99% 99%     General: Awake, sitting up in wheelchair, bending forward, some dry heaving, appears in painful distress reporting severe pain in his middle of his upper abdomen. CV:  Good peripheral perfusion.  No heart tones Resp:  Normal effort.  Clear bilateral.  Speaks without distress Abd:  No distention.  Reports severe tenderness to palpation with some rebound pain noted in the epigastrium and also right upper quadrant.  No definitive positive Eulah Pont as he reports pain across multiple areas of the epigastrium and right upper quadrant.  No chest pain.  No tenderness elicited across the lower  abdomen bilateral. Other:  Lower extremities warm well perfused   ED Results / Procedures / Treatments   Labs (all labs ordered are listed, but only abnormal results are displayed) Labs Reviewed  COMPREHENSIVE METABOLIC PANEL - Abnormal; Notable for the following components:      Result Value   Potassium 3.4 (*)    ALT 79 (*)    All other components within normal limits  URINALYSIS, ROUTINE W REFLEX MICROSCOPIC - Abnormal; Notable for the following components:   Color, Urine YELLOW (*)    APPearance CLEAR (*)    Protein, ur 30 (*)    Bacteria, UA RARE (*)    All other components within normal limits  URINE DRUG SCREEN, QUALITATIVE ([facility] ONLY) - Abnormal; Notable for the following components:   Opiate, Ur Screen POSITIVE (*)    All other components within normal limits  URINE CULTURE  LIPASE, BLOOD  CBC  TROPONIN I (HIGH SENSITIVITY)  TROPONIN I (HIGH SENSITIVITY)     EKG  ED ECG REPORT I, Sharyn Creamer, the attending physician, personally viewed and interpreted  this ECG.  Date: 05/03/2022 EKG Time: 330 Rate: 75 Rhythm: normal sinus rhythm QRS Axis: normal Intervals: normal ST/T Wave abnormalities: normal Narrative Interpretation: no evidence of acute ischemia, slight baseline artifact but no evidence of acute ischemia    RADIOLOGY  CT imaging of the abdomen pelvis personally interpreted by me as negative for acute gross pathology with exception to gallstones  US ABDOMEN LIMITED RUQ (LIVER/GB)  Result Date: 05/03/2022 CLINICAL DATA:  31 year old male with history of generalized abdominal pain. EXAM: ULTRASOUND ABDOMEN LIMITED RIGHT UPPER QUADRANT COMPARISON:  No priors. FINDINGS: Gallbladder: There is some amorphous material lying dependently in the gallbladder, as well as several more echogenic foci with posterior acoustic shadowing, indicative of a combination of biliary sludge and gallstones, with the largest stone measuring up to 8 mm. Gallbladder is only  moderately distended. Gallbladder wall thickness is normal at 2 mm. No pericholecystic fluid. Per report from the sonographer, there was no sonographic Murphy's sign on examination. Common bile duct: Diameter: 3.1 mm Liver: No focal lesion identified. Within normal limits in parenchymal echogenicity. Portal vein is patent on color Doppler imaging with normal direction of blood flow towards the liver. Other: None. IMPRESSION: 1. Today's study is positive for a combination of biliary sludge and stones in the gallbladder. No findings to suggest an acute cholecystitis are noted at this time. Electronically Signed   By: Trudie Reed M.D.   On: 05/03/2022 06:34   CT ABDOMEN PELVIS W CONTRAST  Result Date: 05/03/2022 CLINICAL DATA:  Epigastric abdominal pain EXAM: CT ABDOMEN AND PELVIS WITH CONTRAST TECHNIQUE: Multidetector CT imaging of the abdomen and pelvis was performed using the standard protocol following bolus administration of intravenous contrast. RADIATION DOSE REDUCTION: This exam was performed according to the departmental dose-optimization program which includes automated exposure control, adjustment of the mA and/or kV according to patient size and/or use of iterative reconstruction technique. CONTRAST:  OMNIPAQUE IOHEXOL 350 MG/ML SOLN COMPARISON:  None Available. FINDINGS: Lower chest: No acute abnormality. Hepatobiliary: Cholelithiasis without pericholecystic inflammatory change. Liver unremarkable. No intra or extrahepatic biliary ductal dilation. Pancreas: Unremarkable Spleen: Unremarkable Adrenals/Urinary Tract: Adrenal glands are unremarkable. Kidneys are normal, without renal calculi, focal lesion, or hydronephrosis. Bladder is unremarkable. Stomach/Bowel: Stomach is within normal limits. Appendix appears normal. No evidence of bowel wall thickening, distention, or inflammatory changes. Vascular/Lymphatic: No significant vascular findings are present. No enlarged abdominal or pelvic  lymph nodes. Reproductive: Prostate is unremarkable. Other: No abdominal wall hernia or abnormality. No abdominopelvic ascites. 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 Spina bifida occulta L5. No acute bone abnormality. No lytic or blastic bone lesion. IMPRESSION: 1. No acute intra-abdominal pathology identified. No definite radiographic explanation for the patient's reported symptoms. 2. Cholelithiasis. 3. 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 Electronically Signed   By: Helyn Numbers M.D.   On: 05/03/2022 04:44      PROCEDURES:  Critical Care performed: No  Procedures   MEDICATIONS ORDERED IN ED: Medications  pantoprazole (PROTONIX) EC tablet 40 mg (has no administration in time range)  ondansetron (ZOFRAN) injection 4 mg (4 mg Intravenous Given 05/03/22 0329)  morphine (PF) 4 MG/ML injection 4 mg (4 mg Intravenous Given 05/03/22 0341)  sodium chloride 0.9 % bolus 1,000 mL (0 mLs Intravenous Stopped 05/03/22 0445)  iohexol (OMNIPAQUE) 350 MG/ML injection 100 mL (100 mLs Intravenous Contrast Given 05/03/22 0421)  HYDROmorphone (DILAUDID) injection 1 mg (1 mg Intravenous Given 05/03/22 0408)  ondansetron (ZOFRAN) injection 4 mg (4 mg Intravenous  Given 05/03/22 0408)  ketorolac (TORADOL) 30 MG/ML injection 30 mg (30 mg Intravenous Given 05/03/22 0502)  HYDROmorphone (DILAUDID) injection 0.5 mg (0.5 mg Intravenous Given 05/03/22 0502)  alum & mag hydroxide-simeth (MAALOX/MYLANTA) 200-200-20 MG/5ML suspension 15 mL (15 mLs Oral Given 05/03/22 0502)  famotidine (PEPCID) tablet 20 mg (20 mg Oral Given 05/03/22 0503)  sodium chloride 0.9 % bolus 1,000 mL (1,000 mLs Intravenous New Bag/Given 05/03/22 0711)     IMPRESSION / MDM / ASSESSMENT AND PLAN / ED COURSE  I reviewed the triage vital signs and the nursing notes.                              Differential diagnosis includes but is not limited to, abdominal perforation, aortic  dissection, cholecystitis, appendicitis, diverticulitis, colitis, esophagitis/gastritis, kidney stone, pyelonephritis, urinary tract infection, aortic aneurysm. All are considered in decision and treatment plan. Based upon the patient's presentation and risk factors, will obtain labs, lipase, CT imaging of the abdomen pelvis.  Normal distal vascular exam.  No acute cardiopulmonary complaints.  EKG without evidence of ischemia  High on my differential given his acute presentation and epigastric nature of pain would be acute gastritis, ulcer disease, perforation, pancreatitis, cholelithiasis choledocholithiasis etc.  We will proceed with CT imaging  Patient's presentation is most consistent with acute presentation with potential threat to life or bodily function.  The patient is on the cardiac monitor to evaluate for evidence of arrhythmia and/or significant heart rate changes.  CBC interpreted by me as normal  Clinical Course as of 05/03/22 0723  Tue May 03, 2022  1751 Discussed case, imaging to this point with Dr. Everlene Farrier of general surgery.  Advises reasonable to continue with right upper quadrant ultrasound, and if pain persists with severity after Toradol and additional hydromorphone would be reasonable to consider medical admission for serial examination, and general surgery consult.  However, surgery advising at this point no indication for acute surgery but do recommend further study including right upper quadrant ultrasound which I have ordered [MQ]    Clinical Course User Index [MQ] Sharyn Creamer, MD   ----------------------------------------- 4:01 AM on 05/03/2022 -----------------------------------------  Patient still with severe pain, reports morphine helped a slight amount briefly.  He appears to be in severe pain, fully alert, agonizing reporting severe upper abdominal pain.  Additional Zofran and new order for Dilaudid.  Patient monitoring in place including pulse  oximetry  ----------------------------------------- 4:55 AM on 05/03/2022 ----------------------------------------- CT imaging reviewed notable for cholelithiasis.  Drug screen positive for opioids, however patient was given morphine in the ER and he denies use of illegal substances  ----------------------------------------- 7:22 AM on 05/03/2022 ----------------------------------------- Patient reexamined at approximately 6:45 AM, at that point he reports still having tenderness moderate to palpation in the epigastrium and right upper quadrant.  He does not wish for additional pain medication at this time as he reports that is making him feel "high and loopy" and his pain is improving but still present.  Given the ongoing nature and severity of his epigastric pain I discussed this case with general surgery who will see the patient and provide consult, the patient will be admitted to the hospitalist service under the care of Dr. Egbert Garibaldi.  At this time, suspicion for possible biliary colic or other acute intra-abdominal process does not yet fully elucidated, including potential for peptic ulcer type disease though no clear evidence of perforation on imaging today but I do feel  serial examinations and additional follow-up and surgical consult is warranted.  Patient very understanding agreeable to plan.  FINAL CLINICAL IMPRESSION(S) / ED DIAGNOSES   Final diagnoses:  Epigastric pain  Calculus of gallbladder without cholecystitis without obstruction     Rx / DC Orders   ED Discharge Orders     None        Note:  This document was prepared using Dragon voice recognition software and may include unintentional dictation errors.   Sharyn Creamer, MD 05/03/22 [phone]

## 2022-05-03 NOTE — Anesthesia Procedure Notes (Signed)
Procedure Name: Intubation Date/Time: 05/03/2022 4:04 PM  Performed by: Philbert Riser, CRNAPre-anesthesia Checklist: Patient identified, Patient being monitored, Timeout performed, Emergency Drugs available and Suction available Patient Re-evaluated:Patient Re-evaluated prior to induction Oxygen Delivery Method: Circle system utilized Preoxygenation: Pre-oxygenation with 100% oxygen Induction Type: IV induction Ventilation: Mask ventilation without difficulty Laryngoscope Size: McGraph and 4 Grade View: Grade I Tube type: Oral Tube size: 7.5 mm Number of attempts: 1 Airway Equipment and Method: Stylet Placement Confirmation: ETT inserted through vocal cords under direct vision, positive ETCO2 and breath sounds checked- equal and bilateral Secured at: 21 cm Tube secured with: Tape Dental Injury: Teeth and Oropharynx as per pre-operative assessment

## 2022-05-03 NOTE — Transfer of Care (Addendum)
Immediate Anesthesia Transfer of Care Note  Patient: Jackson Jimenez  Procedure(s) Performed: XI ROBOTIC ASSISTED LAPAROSCOPIC CHOLECYSTECTOMY (Abdomen) INDOCYANINE GREEN FLUORESCENCE IMAGING (ICG) (Abdomen)  Patient Location: PACU  Anesthesia Type:General  Level of Consciousness: drowsy  Airway & Oxygen Therapy: Patient Spontanous Breathing and Patient connected to face mask oxygen  Post-op Assessment: Report given to RN and Post -op Vital signs reviewed and stable  Post vital signs: Reviewed and stable  Last Vitals:  Vitals Value Taken Time  BP    Temp    Pulse    Resp    SpO2      Last Pain:  Vitals:   05/03/22 1346  TempSrc:   PainSc: 5          Complications: No notable events documented.

## 2022-05-03 NOTE — H&P (Signed)
History and Physical    Patient: Jackson Jimenez:[id number] DOB: 01-27-1991 DOA: 05/03/2022 DOS: the patient was seen and examined on 05/03/2022 PCP: Pcp, No  Patient coming from: Home  Chief Complaint:  Chief Complaint  Patient presents with   Abdominal Pain   HPI: Jackson Jimenez is a 31 y.o. male with medical history significant of morbid obesity who presents with complaints of abdominal pain.  Patient last ate around 8 PM last night and reports waking up this morning around 3 AM with severe epigastric pain.  It was described as pressure with a burning sensation that radiated to his right upper quadrant.  Noted having several episodes of nausea and vomiting for which emesis was noted to be nonbloody in appearance.  Patient noted no improvement in symptoms with vomiting.  Denies any significant history of NSAID use.  He reports not having any primary care provider, insurance, or money for any medications.  He makes note that his sister had to have a cholecystectomy. Patient previously used to smoke cigarettes but quit over a year ago and has not drank alcohol since last month.   Upon admission into the emergency department patient was noted to be 140/91-147/89, and all other vital signs maintained.  Labs significant for potassium 3.4, lipase within normal limits, ALT 79, and all other labs within normal limits.  CT scan of the abdomen pelvis noted no acute intra abdominal pathology cholelithiasis and bilateral L5 pares defect without associated spondylolisthesis.  Ultrasound noted biliary sludge and stones in the gallbladder without acute cholecystitis appreciated.  Urinalysis did not appear to show any concerning findings.    General surgery has been consulted to evaluate.  Patient has been given 2 L of normal saline IV fluids, morphine 4 mg IV,, Dilaudid for total of 1.5 mg IV, ketorolac 30 mg IV, Pepcid 20 mg p.o., and Maalox.  UDS was positive for opiates, but thought to be from the  patient was already given.     Review of Systems: As mentioned in the history of present illness. All other systems reviewed and are negative. Past Medical History:  Diagnosis Date   Morbid obesity (HCC)    History reviewed. No pertinent surgical history. Social History:  reports that he has quit smoking. His smoking use included cigarettes. He has never used smokeless tobacco. He reports that he does not currently use alcohol. He reports that he does not currently use drugs.  Allergies  Allergen Reactions   Albuterol Shortness Of Breath    History reviewed. No pertinent family history.  Prior to Admission medications   Medication Sig Start Date End Date Taking? Authorizing Provider  brompheniramine-pseudoephedrine-DM 30-2-10 MG/5ML syrup Take 5 mLs by mouth 4 (four) times daily as needed. 06/08/18   Tommi Rumps, PA-C  cyclobenzaprine (FLEXERIL) 10 MG tablet Take 0.5 tablets (5 mg total) by mouth 3 (three) times daily as needed for muscle spasms. 05/22/20   Triplett, Rulon Eisenmenger B, FNP  diphenhydrAMINE (BENADRYL) 25 mg capsule Take 1 capsule (25 mg total) by mouth every 4 (four) hours as needed. 09/06/16 09/16/16  Orvil Feil, PA-C  meloxicam (MOBIC) 15 MG tablet Take 1 tablet (15 mg total) by mouth daily. 08/25/21 08/25/22  Fisher, Roselyn Bering, PA-C  ondansetron (ZOFRAN-ODT) 4 MG disintegrating tablet Take 1 tablet (4 mg total) by mouth every 8 (eight) hours as needed. 02/12/22   Fisher, Roselyn Bering, PA-C  ranitidine (ZANTAC) 300 MG tablet Take 1 tablet (300 mg total) by mouth at bedtime.  09/06/16 09/06/17  Orvil Feil, PA-C    Physical Exam: Vitals:   05/03/22 0324 05/03/22 0433 05/03/22 0509 05/03/22 0630  BP: (!) 144/73 (!) 147/89 (!) 140/91 (!) 142/92  Pulse: 68 68 63 62  Resp: 20 18 18 12   Temp: 98 F (36.7 C)     TempSrc: Axillary     SpO2: 97% 99% 99% 99%    Constitutional: Middle aged male who appears sick but able to follow commands Eyes: PERRL, lids and conjunctivae  normal ENMT: Mucous membranes are moist.   Neck: normal, supple, no masses, no thyromegaly Respiratory: clear to auscultation bilaterally, no wheezing, no crackles. Normal respiratory effort. No accessory muscle use.  Cardiovascular: Regular rate and rhythm, no murmurs / rubs / gallops. No extremity edema.   Abdomen: Epigastric and right upper quadrant tenderness appreciated.  Bowel sounds present all 4 quadrants. Musculoskeletal: no clubbing / cyanosis. No joint deformity upper and lower extremities. Good ROM, no contractures. Normal muscle tone.  Skin: no rashes, lesions, ulcers. No induration Neurologic: CN 2-12 grossly intact.  Strength 5/5 in all 4.  Psychiatric: Normal judgment and insight.  Thought, but  oriented x 3. Normal mood.    Data Reviewed:  EKG revealed normal sinus rhythm at 76 bpm without any acute ischemic changes.  Reviewed labs imaging and pertinent records as noted above in HPI  Assessment and Plan: Cholelithiasis with suspected acute cholecystitis Acute.  Patient noted severe epigastric and right upper quadrant abdominal pain starting last night..  Work-up included negative high-sensitivity troponins with nonischemic appearing EKG, lipase within normal limits, and urinalysis nonconcerning for infection.  Imaging noted cholelithiasis with biliary sludge, but no other acute intra-abdominal abnormalities.  Patient has been given Pepcid.  Seems general surgery who felt symptoms secondary to acute cholecystitis.  Patient was started on Rocephin IV. On the differential includes biliary colic vs. Gastritis/PUD. -Admit to a telemetry bed -N.p.o. except for meds in preparation for surgery -Morphine IV as needed for pain -Continue Rocephin IV -Continue Protonix daily -General surgery consulted, follow-up for any further recommendations  Hypokalemia Acute.  Potassium 3.4. -Give 40 mEq of potassium chloride p.o. x1 dose -Continue to monitor and replace as needed  Morbid  obesity BMI 41.23 kg/m  Prior history of tobacco and alcohol use Patient reports that he quit smoking cigarettes over a year ago and used to drink alcohol every other day, but has not drank anything since last month.     Advance Care Planning:   Code Status: Full Code  Consults: General surgery  Family Communication: Sister updated at bedside  Severity of Illness: The appropriate patient status for this patient is OBSERVATION. Observation status is judged to be reasonable and necessary in order to provide the required intensity of service to ensure the patient's safety. The patient's presenting symptoms, physical exam findings, and initial radiographic and laboratory data in the context of their medical condition is felt to place them at decreased risk for further clinical deterioration. Furthermore, it is anticipated that the patient will be medically stable for discharge from the hospital within 2 midnights of admission.   Author: , MD 05/03/2022 7:20 AM  For on call review [email or web address].

## 2022-05-03 NOTE — Consult Note (Signed)
Patient ID: Jackson Jimenez, male   DOB: 02/23/1991, 31 y.o.   MRN: [id number]  HPI Jackson Jimenez is a 31 y.o. male seen in consultation at the request of Dr. Fanny Bien for abdominal pain.  He Came in this morning with severe abdominal pain 10 out of 10 located in the epigastric area and right upper quadrant.  The pain is sharp intermittent.  No specific alleviating or aggravating factors.  He has received some narcotics with some relief.  He denies any fevers and chills he did have nausea and vomiting.  The pain is started very early in the morning around 2 AM.  He has never had any pain like this before.  He does have a sister with gallbladder issues.  He did have a CT as well as an ultrasound that I have personally reviewed showing evidence of gallstones without clear-cut cholecystitis.  On the CT he does have a mildly enlarged gallbladder.  Normal common bile duct.  There is a mild ovation of the AST rest of the CMP is completely normal and CBC is completely normal.  He is able to perform more than 4 METS of activity without any shortness of breath or chest pain.  He has never had any abdominal operations. He used to have a history of polysubstance abuse up to 2 years ago but is not done any cocaine or any other illicit substances in over 2 years.  His drug screen was normal. No jaundice or fevers.  HPI  History reviewed. No pertinent past medical history.  History reviewed. No pertinent surgical history.  History reviewed. No pertinent family history.  Social History Social History   Tobacco Use   Smoking status: Former    Types: Cigarettes   Smokeless tobacco: Never  Vaping Use   Vaping Use: Never used  Substance Use Topics   Alcohol use: Not Currently   Drug use: Not Currently    Allergies  Allergen Reactions   Albuterol Shortness Of Breath    Current Facility-Administered Medications  Medication Dose Route Frequency Provider Last Rate Last Admin   0.9 %  sodium chloride  infusion   Intravenous Continuous Smith, Rondell A, MD       acetaminophen (TYLENOL) tablet 650 mg  650 mg Oral Q6H PRN Clydie Braun, MD       Or   acetaminophen (TYLENOL) suppository 650 mg  650 mg Rectal Q6H PRN Smith, Rondell A, MD       cefTRIAXone (ROCEPHIN) 2 g in sodium chloride 0.9 % 100 mL IVPB  2 g Intravenous Q24H Milanie Rosenfield F, MD       Chlorhexidine Gluconate Cloth 2 % PADS 6 each  6 each Topical Q0600 Tyrika Newman F, MD       enoxaparin (LOVENOX) injection 40 mg  40 mg Subcutaneous Q24H Smith, Rondell A, MD       hydrALAZINE (APRESOLINE) injection 10 mg  10 mg Intravenous Q4H PRN Smith, Rondell A, MD       HYDROmorphone (DILAUDID) injection 0.5 mg  0.5 mg Intravenous Q3H PRN Smith, Rondell A, MD       indocyanine green (IC-GREEN) injection 2.5 mg  2.5 mg Intravenous Once Abigale Dorow F, MD       ondansetron (ZOFRAN) tablet 4 mg  4 mg Oral Q6H PRN Madelyn Flavors A, MD       Or   ondansetron (ZOFRAN) injection 4 mg  4 mg Intravenous Q6H PRN Clydie Braun, MD  pantoprazole (PROTONIX) EC tablet 40 mg  40 mg Oral Daily Smith, Rondell A, MD       potassium chloride SA (KLOR-CON M) CR tablet 40 mEq  40 mEq Oral Once Katrinka Blazing, Rondell A, MD       sodium chloride flush (NS) 0.9 % injection 3 mL  3 mL Intravenous Q12H Smith, Rondell A, MD       Current Outpatient Medications  Medication Sig Dispense Refill   brompheniramine-pseudoephedrine-DM 30-2-10 MG/5ML syrup Take 5 mLs by mouth 4 (four) times daily as needed. 120 mL 0   cyclobenzaprine (FLEXERIL) 10 MG tablet Take 0.5 tablets (5 mg total) by mouth 3 (three) times daily as needed for muscle spasms. 30 tablet 0   diphenhydrAMINE (BENADRYL) 25 mg capsule Take 1 capsule (25 mg total) by mouth every 4 (four) hours as needed. 30 capsule 2   meloxicam (MOBIC) 15 MG tablet Take 1 tablet (15 mg total) by mouth daily. 30 tablet 2   ondansetron (ZOFRAN-ODT) 4 MG disintegrating tablet Take 1 tablet (4 mg total) by mouth every 8 (eight)  hours as needed. 20 tablet 0   ranitidine (ZANTAC) 300 MG tablet Take 1 tablet (300 mg total) by mouth at bedtime. 30 tablet 1     Review of Systems Full ROS  was asked and was negative except for the information on the HPI  Physical Exam Blood pressure (!) 142/92, pulse 62, temperature 98 F (36.7 C), temperature source Axillary, resp. rate 12, SpO2 99 %. CONSTITUTIONAL: NAD. EYES: Pupils are equal, round, and reactive to light, Sclera are non-icteric. EARS, NOSE, MOUTH AND THROAT: The oropharynx is clear. The oral mucosa is pink and moist. Hearing is intact to voice. LYMPH NODES:  Lymph nodes in the neck are normal. RESPIRATORY:  Lungs are clear. There is normal respiratory effort, with equal breath sounds bilaterally, and without pathologic use of accessory muscles. CARDIOVASCULAR: Heart is regular without murmurs, gallops, or rubs. GI: The abdomen is  soft, there to palpation epigastric and right upper quadrant.  Equivocal Murphy sign no peritonitis there are no palpable masses. There is no hepatosplenomegaly. There are normal bowel sounds in all quadrants. GU: Rectal deferred.   MUSCULOSKELETAL: Normal muscle strength and tone. No cyanosis or edema.   SKIN: Turgor is good and there are no pathologic skin lesions or ulcers. NEUROLOGIC: Motor and sensation is grossly normal. Cranial nerves are grossly intact. PSYCH:  Oriented to person, place and time. Affect is normal.  Data Reviewed  I have personally reviewed the patient's imaging, laboratory findings and medical records.    Assessment/Plan 31 year old male without any major medical issues comes in with acute abdominal pain.  Clinical findings are certainly concerning for acute cholecystitis.  Imaging studies do not necessarily reflect this but I do think that its early stages.  I had an extensive discussion with the patient regarding his disease process.  I do agree with admission IV fluids IV antibiotics.  Also discussed with him  about proceeding to the operating room for cholecystectomy.  Currently there is no other good explanation for his symptoms.  I personally think that clinically  that he does have acute cholecystitis. I discussed the procedure in detail.  The patient was given Agricultural engineer.  We discussed the risks and benefits of a laparoscopic cholecystectomy and possible cholangiogram including, but not limited to bleeding, infection, injury to surrounding structures such as the intestine or liver, bile leak, retained gallstones, need to convert to an open procedure, prolonged diarrhea,  blood clots such as  DVT, common bile duct injury, anesthesia risks, and possible need for additional procedures.  The likelihood of improvement in symptoms and return to the patient's normal status is good. We discussed the typical post-operative recovery course.  Spent 75 minutes in this encounter including personally reviewing imaging studies, coordinating his care, placing orders, counseling the patient and performing appropriate documentation   Sterling Big, MD FACS General Surgeon 05/03/2022, 7:47 AM

## 2022-05-03 NOTE — ED Notes (Signed)
Pt to CT

## 2022-05-03 NOTE — ED Triage Notes (Addendum)
To triage with c/o generalized abd pain and pressure. Woke pt up from sleeping. Pt unable to describe pain, visibly distressed and unable to sit still in triage. Pt diaphoretic and actively vomiting. Pt states he had [place] food at [employer].

## 2022-05-03 NOTE — ED Notes (Signed)
US @ the bedside. 

## 2022-05-03 NOTE — ED Notes (Signed)
Pt back from CT

## 2022-05-04 DIAGNOSIS — R109 Unspecified abdominal pain: Secondary | ICD-10-CM | POA: Diagnosis present

## 2022-05-04 DIAGNOSIS — R1011 Right upper quadrant pain: Secondary | ICD-10-CM

## 2022-05-04 LAB — CBC
HCT: 40.5 % (ref 39.0–52.0)
Hemoglobin: 13.6 g/dL (ref 13.0–17.0)
MCH: 28.1 pg (ref 26.0–34.0)
MCHC: 33.6 g/dL (ref 30.0–36.0)
MCV: 83.7 fL (ref 80.0–100.0)
Platelets: 241 10*3/uL (ref 150–400)
RBC: 4.84 MIL/uL (ref 4.22–5.81)
RDW: 13.1 % (ref 11.5–15.5)
WBC: 14 10*3/uL — ABNORMAL HIGH (ref 4.0–10.5)
nRBC: 0 % (ref 0.0–0.2)

## 2022-05-04 LAB — COMPREHENSIVE METABOLIC PANEL
ALT: 195 U/L — ABNORMAL HIGH (ref 0–44)
AST: 134 U/L — ABNORMAL HIGH (ref 15–41)
Albumin: 3.7 g/dL (ref 3.5–5.0)
Alkaline Phosphatase: 90 U/L (ref 38–126)
Anion gap: 4 — ABNORMAL LOW (ref 5–15)
BUN: 9 mg/dL (ref 6–20)
CO2: 26 mmol/L (ref 22–32)
Calcium: 8.6 mg/dL — ABNORMAL LOW (ref 8.9–10.3)
Chloride: 110 mmol/L (ref 98–111)
Creatinine, Ser: 0.84 mg/dL (ref 0.61–1.24)
GFR, Estimated: >60 mL/min (ref >60)
Glucose, Bld: 120 mg/dL — ABNORMAL HIGH (ref 70–99)
Potassium: 4.4 mmol/L (ref 3.5–5.1)
Sodium: 140 mmol/L (ref 135–145)
Total Bilirubin: 0.9 mg/dL (ref 0.3–1.2)
Total Protein: 7.1 g/dL (ref 6.5–8.1)

## 2022-05-04 LAB — URINE CULTURE: Culture: NO GROWTH

## 2022-05-04 NOTE — Progress Notes (Signed)
Mobility Specialist - Progress Note    05/04/22 1500  Mobility  Activity Ambulated with assistance in hallway;Stood at bedside;Dangled on edge of bed  Level of Assistance Contact guard assist, steadying assist  Assistive Device None  Distance Ambulated (ft) 40 ft  Activity Response Tolerated well  $Mobility charge 1 Mobility    Pt sitting in bed upon arrival using RA. Voices 4/10 abdominal pain and completes bed mobility ModI + extra time. Completes STS CGA due to pain -- grimacing due to pain throughout and describes it as "sharp." Ambulates 35ft with HHA, slow and controlled gait. Returns to bed with needs in reach.  Clarisa Schools Mobility Specialist 05/04/22, 3:46 PM

## 2022-05-04 NOTE — Progress Notes (Signed)
[facility] SURGICAL PROGRESS NOTE  Hospital Day(s): 0.   Post op day(s): 1 Day Post-Op.   Interval History:  Patient seen and examined No acute events or new complaints overnight.  Patient reports he is doing well; abdominal soreness No fever, chills, nausea, emesis Mild leukocytosis this morning to 14.0K; likely reaction from surgery CMP is reassuring; mild AST/ALT elevation consistent with cholecystectomy Surgical drain with 70 ccs out; serosanguinous He is on FLD; tolerating; having flatus Currently on Rocephin  Vital signs in last 24 hours: [min-max] current  Temp:  [96.8 F (36 C)-98.9 F (37.2 C)] 98.1 F (36.7 C) (08/23 0505) Pulse Rate:  [65-95] 66 (08/23 0505) Resp:  [9-23] 18 (08/23 0505) BP: (121-151)/(68-102) 125/75 (08/23 0505) SpO2:  [86 %-100 %] 98 % (08/23 0505) Weight:  [137.9 kg] 137.9 kg (08/22 1346)     Height: 6' (182.9 cm) Weight: (!) 137.9 kg BMI (Calculated): 41.22   Intake/Output last 2 shifts:  08/22 0701 - 08/23 0700 In: 4218.5 [P.O.:630; I.V.:2385.2; IV Piggyback:1203.3] Out: 3971 [Urine:3900; Drains:70; Blood:1]   Physical Exam:  Constitutional: alert, cooperative and no distress  Respiratory: breathing non-labored at rest  Cardiovascular: regular rate and sinus rhythm  Gastrointestinal: Soft, expected incisional soreness, non-distended, no rebound/guarding. Surgical drain in right lateral port site; output serosanguinous  Integumentary: Laparoscopic incisions are CDI with dermabond, no erythema or drainage   Labs:     Latest Ref Rng & Units 05/04/2022    4:46 AM 05/03/2022    3:26 AM 02/12/2022    5:27 AM  CBC  WBC 4.0 - 10.5 K/uL 14.0  10.0  9.8   Hemoglobin 13.0 - 17.0 g/dL 95.1  88.4  16.6   Hematocrit 39.0 - 52.0 % 40.5  44.6  45.0   Platelets 150 - 400 K/uL 241  255  232       Latest Ref Rng & Units 05/04/2022    4:46 AM 05/03/2022    3:26 AM 02/12/2022    5:27 AM  CMP  Glucose 70 - 99 mg/dL 063  99  016   BUN 6  - 20 mg/dL 9  14  15    Creatinine 0.61 - 1.24 mg/dL  0.10  9.32   Sodium 135 - 145 mmol/L 140  140  139   Potassium 3.5 - 5.1 mmol/L 4.4  3.4  3.9   Chloride 98 - 111 mmol/L 110  107  103   CO2 22 - 32 mmol/L 26  24  30    Calcium 8.9 - 10.3 mg/dL 8.6  9.0  9.2   Total Protein 6.5 - 8.1 g/dL 7.1  8.0  7.9   Total Bilirubin 0.3 - 1.2 mg/dL 0.9  0.7  0.5   Alkaline Phos 38 - 126 U/L 90  91  81   AST 15 - 41 U/L 134  31  32   ALT 0 - 44 U/L 195  79  49      Imaging studies: No new pertinent imaging studies   Assessment/Plan:  31 y.o. male 1 Day Post-Op s/p robotic assisted laparoscopic cholecystectomy for cholecystitis   - Advance diet as tolerated - Discontinue IVF - Continue IV Abx (Rocephin); PO for home at DC - Monitor abdominal examination   - Continue surgical drain; monitor and record output  - Pain control prn; antiemetics prn   - OOB today   - Discharge Planning; Patient anxious about leukocytosis, which is certainly just reactive from surgery. He would like another  day in the hospital, which is reasonable. I ordered recheck CBC for the morning; If this is improved and he is doing well, he can DC tomorrow AM.   All of the above findings and recommendations were discussed with the patient, and the medical team, and all of patient's questions were answered to his expressed satisfaction.  -- Lynden Oxford, PA-C [facility] 05/04/2022, 7:17 AM M-F: 7am - 4pm

## 2022-05-04 NOTE — TOC Initial Note (Signed)
Transition of Care [facility]) - Initial/Assessment Note    Patient Details  Name: BERNARR LONGSWORTH MRN: [id number] Date of Birth: Oct 31, 1990  [facility]) CM/SW Contact:    Chapman Fitch, RN Phone Number: 05/04/2022, 4:24 PM  Clinical Narrative:                  Admitted for:1 Day Post-Op s/p robotic assisted laparoscopic cholecystectomy  Admitted from: Home PCP:no PCP  Current home health/prior home health/DME:NA   Patient lives in [place].  Provided information to [facility] which has a sliding scale copay.  Patient states he has been employee at his new job for 3 weeks, and should have insurance coverage after being there for 90 days.  TOC following for dc medications        Patient Goals and CMS Choice        Expected Discharge Plan and Services                                                Prior Living Arrangements/Services                       Activities of Daily Living Home Assistive Devices/Equipment: None ADL Screening (condition at time of admission) Patient's cognitive ability adequate to safely complete daily activities?: Yes Is the patient deaf or have difficulty hearing?: No Does the patient have difficulty seeing, even when wearing glasses/contacts?: No Does the patient have difficulty concentrating, remembering, or making decisions?: No Patient able to express need for assistance with ADLs?: Yes Does the patient have difficulty dressing or bathing?: No Independently performs ADLs?: Yes (appropriate for developmental age) Does the patient have difficulty walking or climbing stairs?: No Weakness of Legs: None Weakness of Arms/Hands: None  Permission Sought/Granted                  Emotional Assessment              Admission diagnosis:  Epigastric pain [R10.13] Calculus of gallbladder without cholecystitis without obstruction [K80.20] Abdominal pain [R10.9] Patient Active Problem  List   Diagnosis Date Noted   Abdominal pain 05/04/2022   Cholelithiasis with acute cholecystitis 05/03/2022   Hypokalemia 05/03/2022   Obesity, Class III, BMI 40-49.9 (morbid obesity) (HCC) 05/03/2022   PCP:  Oneita Hurt, No Pharmacy:   [facility] [address] [place] - [address] [place] [address] Phone: [phone] Fax: [phone]  [facility] [address] [place], [place] - [address] [phone] [place] [address] Phone: [phone] Fax: [phone]  [facility] [address]), [place] - [address] [place] ([address]) [place] [address] Phone: [phone] Fax: [phone]  [facility] [address] [place] - [address] [place] [address] Phone: [phone] Fax: [phone]     Social Determinants of Health (SDOH) Interventions    Readmission Risk Interventions     No data to display

## 2022-05-04 NOTE — Progress Notes (Signed)
[facility]  - [place] [facility]   PATIENT NAME: Jackson Jimenez    MR#:  [id number]  DATE OF BIRTH:  [birth year]  SUBJECTIVE:  Tolerating po diet No fever  VITALS:  Blood pressure 125/81, pulse (!) 58, temperature (!) 97.4 F (36.3 C), resp. rate 20, height 6' (1.829 m), weight (!) 137.9 kg, SpO2 100 %.  PHYSICAL EXAMINATION:   GENERAL:  31 y.o.-year-old patient lying in the bed with no acute distress.obese  LUNGS: Normal breath sounds bilaterally, no wheezing, rales, rhonchi.  CARDIOVASCULAR: S1, S2 normal. No murmurs, rubs, or gallops.  ABDOMEN: Soft, nontender, nondistended. Bowel sounds present. Drain + EXTREMITIES: No  edema b/l.    NEUROLOGIC: nonfocal  patient is alert and awake SKIN: No obvious rash, lesion, or ulcer.   LABORATORY PANEL:  CBC Recent Labs  Lab 05/04/22 0446  WBC 14.0*  HGB 13.6  HCT 40.5  PLT 241    Chemistries  Recent Labs  Lab 05/04/22 0446  NA 140  K 4.4  CL 110  CO2 26  GLUCOSE 120*  BUN 9  CREATININE 0.84  CALCIUM 8.6*  AST 134*  ALT 195*  ALKPHOS 90  BILITOT 0.9   Cardiac Enzymes No results for input(s): "TROPONINI" in the last 168 hours. RADIOLOGY:  US ABDOMEN LIMITED RUQ (LIVER/GB)  Result Date: 05/03/2022 CLINICAL DATA:  31 year old male with history of generalized abdominal pain. EXAM: ULTRASOUND ABDOMEN LIMITED RIGHT UPPER QUADRANT COMPARISON:  No priors. FINDINGS: Gallbladder: There is some amorphous material lying dependently in the gallbladder, as well as several more echogenic foci with posterior acoustic shadowing, indicative of a combination of biliary sludge and gallstones, with the largest stone measuring up to 8 mm. Gallbladder is only moderately distended. Gallbladder wall thickness is normal at 2 mm. No pericholecystic fluid. Per report from the sonographer, there was no sonographic Murphy's sign on examination. Common bile duct: Diameter: 3.1 mm Liver: No focal lesion identified. Within normal  limits in parenchymal echogenicity. Portal vein is patent on color Doppler imaging with normal direction of blood flow towards the liver. Other: None. IMPRESSION: 1. Today's study is positive for a combination of biliary sludge and stones in the gallbladder. No findings to suggest an acute cholecystitis are noted at this time. Electronically Signed   By: Trudie Reed M.D.   On: 05/03/2022 06:34   CT ABDOMEN PELVIS W CONTRAST  Result Date: 05/03/2022 CLINICAL DATA:  Epigastric abdominal pain EXAM: CT ABDOMEN AND PELVIS WITH CONTRAST TECHNIQUE: Multidetector CT imaging of the abdomen and pelvis was performed using the standard protocol following bolus administration of intravenous contrast. RADIATION DOSE REDUCTION: This exam was performed according to the departmental dose-optimization program which includes automated exposure control, adjustment of the mA and/or kV according to patient size and/or use of iterative reconstruction technique. CONTRAST:  OMNIPAQUE IOHEXOL 350 MG/ML SOLN COMPARISON:  None Available. FINDINGS: Lower chest: No acute abnormality. Hepatobiliary: Cholelithiasis without pericholecystic inflammatory change. Liver unremarkable. No intra or extrahepatic biliary ductal dilation. Pancreas: Unremarkable Spleen: Unremarkable Adrenals/Urinary Tract: Adrenal glands are unremarkable. Kidneys are normal, without renal calculi, focal lesion, or hydronephrosis. Bladder is unremarkable. Stomach/Bowel: Stomach is within normal limits. Appendix appears normal. No evidence of bowel wall thickening, distention, or inflammatory changes. Vascular/Lymphatic: No significant vascular findings are present. No enlarged abdominal or pelvic lymph nodes. Reproductive: Prostate is unremarkable. Other: No abdominal wall hernia or abnormality. No abdominopelvic ascites. 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 Spina bifida occulta L5. No acute bone  abnormality. No lytic  or blastic bone lesion. IMPRESSION: 1. No acute intra-abdominal pathology identified. No definite radiographic explanation for the patient's reported symptoms. 2. Cholelithiasis. 3. 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 Electronically Signed   By: Helyn Numbers M.D.   On: 05/03/2022 04:44    Assessment and Plan Jackson Jimenez is a 31 y.o. male with medical history significant of morbid obesity who presents with complaints of abdominal pain.  Patient last ate around 8 PM last night and reports waking up this morning around 3 AM with severe epigastric pain.  It was described as pressure with a burning sensation that radiated to his right upper quadrant.  CT scan of the abdomen pelvis noted no acute intra abdominal pathology cholelithiasis and bilateral L5 pares defect without associated spondylolisthesis.    Ultrasound noted biliary sludge and stones in the gallbladder without acute cholecystitis appreciated.   Cholelithiasis --s/p Lap cholecystectomy Acute cholecystitis  Transaminitis --Patient came in with severe epigastric and right upper quadrant abdominal pain starting last night.. -- lipase within normal limits --Patient was started on Rocephin IV.  -Appreciate Surgery input and help -Morphine IV as needed for pain -Continue Rocephin IV -Continue Protonix daily -General surgery recommends stay overnite--check cbc  --mild leucocytosis suspected reactive with GB dz --tolerating po diet --encourage Ambulation  Hypokalemia Repleted to 4.4   Morbid obesity BMI 41.23 kg/m   Prior history of tobacco and alcohol use Patient reports that he quit smoking cigarettes over a year ago and used to drink alcohol every other day, but has not drank anything since last month.       Procedures: S/p Lap cholecystectomy Family communication :none Consults :gen surgery CODE STATUS: full DVT Prophylaxis :enoxaparin Level of care: Med-Surg Status is:  Inpatient Remains inpatient appropriate because: observe another nite and check cbc in am per surgery recs    TOTAL TIME TAKING CARE OF THIS PATIENT: 35 minutes.  >50% time spent on counselling and coordination of care  Note: This dictation was prepared with Dragon dictation along with smaller phrase technology. Any transcriptional errors that result from this process are unintentional.  Enedina Finner M.D    [facility]   CC: Primary care physician; Pcp, No

## 2022-05-04 NOTE — Plan of Care (Signed)

## 2022-05-05 LAB — COMPREHENSIVE METABOLIC PANEL
ALT: 203 U/L — ABNORMAL HIGH (ref 0–44)
AST: 110 U/L — ABNORMAL HIGH (ref 15–41)
Albumin: 3.3 g/dL — ABNORMAL LOW (ref 3.5–5.0)
Alkaline Phosphatase: 81 U/L (ref 38–126)
Anion gap: 4 — ABNORMAL LOW (ref 5–15)
BUN: 13 mg/dL (ref 6–20)
CO2: 26 mmol/L (ref 22–32)
Calcium: 8.4 mg/dL — ABNORMAL LOW (ref 8.9–10.3)
Chloride: 110 mmol/L (ref 98–111)
Creatinine, Ser: 0.9 mg/dL (ref 0.61–1.24)
GFR, Estimated: >60 mL/min (ref >60)
Glucose, Bld: 102 mg/dL — ABNORMAL HIGH (ref 70–99)
Potassium: 3.6 mmol/L (ref 3.5–5.1)
Sodium: 140 mmol/L (ref 135–145)
Total Bilirubin: 0.6 mg/dL (ref 0.3–1.2)
Total Protein: 6.2 g/dL — ABNORMAL LOW (ref 6.5–8.1)

## 2022-05-05 LAB — CBC
HCT: 37.8 % — ABNORMAL LOW (ref 39.0–52.0)
Hemoglobin: 12.3 g/dL — ABNORMAL LOW (ref 13.0–17.0)
MCH: 27.5 pg (ref 26.0–34.0)
MCHC: 32.5 g/dL (ref 30.0–36.0)
MCV: 84.6 fL (ref 80.0–100.0)
Platelets: 176 10*3/uL (ref 150–400)
RBC: 4.47 MIL/uL (ref 4.22–5.81)
RDW: 13.4 % (ref 11.5–15.5)
WBC: 7.8 10*3/uL (ref 4.0–10.5)
nRBC: 0 % (ref 0.0–0.2)

## 2022-05-05 LAB — SURGICAL PATHOLOGY

## 2022-05-05 MED ORDER — AMOXICILLIN-POT CLAVULANATE 875-125 MG PO TABS: 1.0000 | ORAL_TABLET | Freq: Two times a day (BID) | ORAL | 0 refills | Status: AC

## 2022-05-05 MED ORDER — OXYCODONE HCL 5 MG PO TABS: 5.0000 mg | ORAL_TABLET | Freq: Three times a day (TID) | ORAL | 0 refills | Status: DC | PRN

## 2022-05-05 MED ORDER — AMOXICILLIN-POT CLAVULANATE 875-125 MG PO TABS
1.0000 | ORAL_TABLET | Freq: Two times a day (BID) | ORAL | Status: DC
  Administered 2022-05-05: 1 via ORAL
  Filled 2022-05-05: qty 1

## 2022-05-05 NOTE — Progress Notes (Addendum)
[facility] SURGICAL PROGRESS NOTE  Hospital Day(s): 1.   Post op day(s): 2 Days Post-Op.   Interval History:  Patient seen and examined No acute events or new complaints overnight.  Patient reports he is feeling better; abdomen is sore but nothing "he cant handle" No fever, chills, nausea, emesis Leukocytosis has resolved; 7.8K CMP is reassuring; mild AST/ALT elevation consistent with cholecystectomy Surgical drain with 20 ccs out; serosanguinous He is on regular; tolerating; having flatus Currently on Rocephin  Vital signs in last 24 hours: [min-max] current  Temp:  [97.4 F (36.3 C)-98.1 F (36.7 C)] 98.1 F (36.7 C) (08/24 0757) Pulse Rate:  [56-76] 59 (08/24 0757) Resp:  [16-20] 18 (08/24 0757) BP: (119-135)/(72-91) 127/85 (08/24 0757) SpO2:  [98 %-100 %] 98 % (08/24 0757)     Height: 6' (182.9 cm) Weight: (!) 137.9 kg BMI (Calculated): 41.22   Intake/Output last 2 shifts:  08/23 0701 - 08/24 0700 In: -  Out: 20 [Drains:20]   Physical Exam:  Constitutional: alert, cooperative and no distress  Respiratory: breathing non-labored at rest  Cardiovascular: regular rate and sinus rhythm  Gastrointestinal: Soft, expected incisional soreness, non-distended, no rebound/guarding. Surgical drain in right lateral port site; output serosanguinous (removed) Integumentary: Laparoscopic incisions are CDI with dermabond, no erythema or drainage   Labs:     Latest Ref Rng & Units 05/05/2022    6:27 AM 05/04/2022    4:46 AM 05/03/2022    3:26 AM  CBC  WBC 4.0 - 10.5 K/uL 7.8  14.0  10.0   Hemoglobin 13.0 - 17.0 g/dL 60.6  30.1  60.1   Hematocrit 39.0 - 52.0 % 37.8  40.5  44.6   Platelets 150 - 400 K/uL 176  241  255       Latest Ref Rng & Units 05/05/2022    6:27 AM 05/04/2022    4:46 AM 05/03/2022    3:26 AM  CMP  Glucose 70 - 99 mg/dL 093  235  99   BUN 6 - 20 mg/dL 13  9  14    Creatinine 0.61 - 1.24 mg/dL  5.73  2.20   Sodium 135 - 145 mmol/L 140  140   140   Potassium 3.5 - 5.1 mmol/L 3.6  4.4  3.4   Chloride 98 - 111 mmol/L 110  110  107   CO2 22 - 32 mmol/L 26  26  24    Calcium 8.9 - 10.3 mg/dL 8.4  8.6  9.0   Total Protein 6.5 - 8.1 g/dL 6.2  7.1  8.0   Total Bilirubin 0.3 - 1.2 mg/dL 0.6  0.9  0.7   Alkaline Phos 38 - 126 U/L 81  90  91   AST 15 - 41 U/L 110  134  31   ALT 0 - 44 U/L 203  195  79      Imaging studies: No new pertinent imaging studies   Assessment/Plan:  31 y.o. male 2 Days Post-Op s/p robotic assisted laparoscopic cholecystectomy for cholecystitis   - Continue regular diet; Reviewed home dietary recommendation s/p cholecystectomy  - Continue IV Abx (Rocephin); PO Augmentin for home - Monitor abdominal examination   - Will DC drain today  - Pain control prn; antiemetics prn    - Discharge Planning; Okay for discharge from surgery perspective. I will do Abx and pain medication Rx. Discontinue drain before DC. He will follow up in 2 weeks; I will place appointment in chart.  All of the above findings and recommendations were discussed with the patient, and the medical team, and all of patient's questions were answered to his expressed satisfaction.  -- Lynden Oxford, PA-C [facility] 05/05/2022, 8:07 AM M-F: 7am - 4pm

## 2022-05-05 NOTE — Anesthesia Postprocedure Evaluation (Signed)
Anesthesia Post Note  Patient: Jackson Jimenez  Procedure(s) Performed: XI ROBOTIC ASSISTED LAPAROSCOPIC CHOLECYSTECTOMY (Abdomen) INDOCYANINE GREEN FLUORESCENCE IMAGING (ICG) (Abdomen)  Patient location during evaluation: PACU Anesthesia Type: General Level of consciousness: awake and alert Pain management: pain level controlled Vital Signs Assessment: post-procedure vital signs reviewed and stable Respiratory status: spontaneous breathing, nonlabored ventilation, respiratory function stable and patient connected to nasal cannula oxygen Cardiovascular status: blood pressure returned to baseline and stable Postop Assessment: no apparent nausea or vomiting Anesthetic complications: no   No notable events documented.   Last Vitals:  Vitals:   05/05/22 0329 05/05/22 0757  BP: (!) 135/91 127/85  Pulse: 61 (!) 59  Resp: 20 18  Temp: 36.7 C 36.7 C  SpO2: 99% 98%    Last Pain:  Vitals:   05/05/22 0757  TempSrc: Oral  PainSc:                  Jackson Jimenez

## 2022-05-05 NOTE — Progress Notes (Signed)
Mobility Specialist - Progress Note    05/05/22 1100  Mobility  Activity Ambulated independently in hallway;Stood at bedside  Level of Assistance Independent  Assistive Device None  Distance Ambulated (ft) 200 ft  Activity Response Tolerated well  $Mobility charge 1 Mobility    Clarisa Schools Mobility Specialist 05/05/22, 11:14 AM

## 2022-05-05 NOTE — Discharge Summary (Signed)
Physician Discharge Summary   Patient: Jackson Jimenez MRN: [id number] DOB: 1990-11-15  Admit date:     05/03/2022  Discharge date: 05/05/22  Discharge Physician: Enedina Finner   PCP: Pcp, No   Recommendations at discharge:    F/u Ian Malkin, PA surgery as per instructions  Discharge Diagnoses:  Acute Cholecystitis due to Cholelithiasis s/p Lap Cholecystectomy  Hospital Course:  Jackson Jimenez is a 31 y.o. male with medical history significant of morbid obesity who presents with complaints of abdominal pain.  Patient last ate around 8 PM last night and reports waking up this morning around 3 AM with severe epigastric pain.  It was described as pressure with a burning sensation that radiated to his right upper quadrant.   CT scan of the abdomen pelvis noted no acute intra abdominal pathology cholelithiasis and bilateral L5 pares defect without associated spondylolisthesis.     Ultrasound noted biliary sludge and stones in the gallbladder without acute cholecystitis appreciated.     Cholelithiasis --s/p Lap cholecystectomy Acute cholecystitis  Transaminitis --Patient came in with severe epigastric and right upper quadrant abdominal pain starting last night. --lipase within normal limits --Patient was started on Rocephin IV--change to po Augmentin -Appreciate Surgery input and help --General surgery recommends stay overnite--check cbc --down to 7.9 --mild leucocytosis suspected reactive with GB dz --tolerating po diet --encourage Ambulation   Hypokalemia Repleted to 4.4   Morbid obesity BMI 41.23 kg/m   Prior history of tobacco and alcohol use Patient reports that he quit smoking cigarettes over a year ago and used to drink alcohol every other day, but has not drank anything since last month.   OK to go home today with out pt surgery f/u      Procedures: S/p Lap cholecystectomy Family communication :none Consults :gen surgery CODE STATUS: full DVT Prophylaxis  :enoxaparin Level of care: Med-Surg Disposition: Home Diet recommendation:  Discharge Diet Orders (From admission, onward)     Start     Ordered   05/05/22 0000  Diet general        05/05/22 0825           Regular diet DISCHARGE MEDICATION: Allergies as of 05/05/2022       Reactions   Albuterol Shortness Of Breath        Medication List     STOP taking these medications    brompheniramine-pseudoephedrine-DM 30-2-10 MG/5ML syrup   cyclobenzaprine 10 MG tablet Commonly known as: FLEXERIL   diphenhydrAMINE 25 mg capsule Commonly known as: BENADRYL   meloxicam 15 MG tablet Commonly known as: MOBIC   ondansetron 4 MG disintegrating tablet Commonly known as: ZOFRAN-ODT   ranitidine 300 MG tablet Commonly known as: ZANTAC       TAKE these medications    amoxicillin-clavulanate 875-125 MG tablet Commonly known as: AUGMENTIN Take 1 tablet by mouth every 12 (twelve) hours for 5 days.   oxyCODONE 5 MG immediate release tablet Commonly known as: Oxy IR/ROXICODONE Take 1 tablet (5 mg total) by mouth every 8 (eight) hours as needed for moderate pain or severe pain.        Follow-up Information     Donovan Kail, PA-C. Schedule an appointment as soon as possible for a visit in 2 week(s).   Specialty: Physician Assistant Why: s/p laparoscopic cholecystectomy Contact information: [address] [place] [address] [phone]                Discharge Exam: Ceasar Mons Weights   05/03/22 [phone]  05/03/22 1346  Weight: (!) 137.9 kg (!) 137.9 kg     Condition at discharge: fair  The results of significant diagnostics from this hospitalization (including imaging, microbiology, ancillary and laboratory) are listed below for reference.   Imaging Studies: US ABDOMEN LIMITED RUQ (LIVER/GB)  Result Date: 05/03/2022 CLINICAL DATA:  31 year old male with history of generalized abdominal pain. EXAM: ULTRASOUND ABDOMEN LIMITED RIGHT UPPER  QUADRANT COMPARISON:  No priors. FINDINGS: Gallbladder: There is some amorphous material lying dependently in the gallbladder, as well as several more echogenic foci with posterior acoustic shadowing, indicative of a combination of biliary sludge and gallstones, with the largest stone measuring up to 8 mm. Gallbladder is only moderately distended. Gallbladder wall thickness is normal at 2 mm. No pericholecystic fluid. Per report from the sonographer, there was no sonographic Murphy's sign on examination. Common bile duct: Diameter: 3.1 mm Liver: No focal lesion identified. Within normal limits in parenchymal echogenicity. Portal vein is patent on color Doppler imaging with normal direction of blood flow towards the liver. Other: None. IMPRESSION: 1. Today's study is positive for a combination of biliary sludge and stones in the gallbladder. No findings to suggest an acute cholecystitis are noted at this time. Electronically Signed   By: Trudie Reed M.D.   On: 05/03/2022 06:34   CT ABDOMEN PELVIS W CONTRAST  Result Date: 05/03/2022 CLINICAL DATA:  Epigastric abdominal pain EXAM: CT ABDOMEN AND PELVIS WITH CONTRAST TECHNIQUE: Multidetector CT imaging of the abdomen and pelvis was performed using the standard protocol following bolus administration of intravenous contrast. RADIATION DOSE REDUCTION: This exam was performed according to the departmental dose-optimization program which includes automated exposure control, adjustment of the mA and/or kV according to patient size and/or use of iterative reconstruction technique. CONTRAST:  OMNIPAQUE IOHEXOL 350 MG/ML SOLN COMPARISON:  None Available. FINDINGS: Lower chest: No acute abnormality. Hepatobiliary: Cholelithiasis without pericholecystic inflammatory change. Liver unremarkable. No intra or extrahepatic biliary ductal dilation. Pancreas: Unremarkable Spleen: Unremarkable Adrenals/Urinary Tract: Adrenal glands are unremarkable. Kidneys are normal,  without renal calculi, focal lesion, or hydronephrosis. Bladder is unremarkable. Stomach/Bowel: Stomach is within normal limits. Appendix appears normal. No evidence of bowel wall thickening, distention, or inflammatory changes. Vascular/Lymphatic: No significant vascular findings are present. No enlarged abdominal or pelvic lymph nodes. Reproductive: Prostate is unremarkable. Other: No abdominal wall hernia or abnormality. No abdominopelvic ascites. 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 Spina bifida occulta L5. No acute bone abnormality. No lytic or blastic bone lesion. IMPRESSION: 1. No acute intra-abdominal pathology identified. No definite radiographic explanation for the patient's reported symptoms. 2. Cholelithiasis. 3. 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 Electronically Signed   By: Helyn Numbers M.D.   On: 05/03/2022 04:44    Microbiology: Results for orders placed or performed during the hospital encounter of 05/03/22  Urine Culture     Status: None   Collection Time: 05/03/22  4:00 AM   Specimen: Urine, Clean Catch  Result Value Ref Range Status   Specimen Description   Final    URINE, CLEAN CATCH Performed at [facility], [address], [place], [place] [address]    Special Requests   Final    NONE Performed at [facility], [address] [place], [place] [address]    Culture   Final    NO GROWTH Performed at [facility], [address] [place], [place] [address]    Report Status 05/04/2022 FINAL  Final    Labs:  CBC: Recent Labs  Lab 05/03/22 0326 05/04/22 0446 05/05/22 0627  WBC 10.0 14.0* 7.8  HGB 14.9 13.6 12.3*  HCT 44.6 40.5 37.8*  MCV 82.4 83.7 84.6  PLT 255 241 176   Basic Metabolic Panel: Recent Labs  Lab 05/03/22 0326 05/04/22 0446 05/05/22 0627  NA 140 140 140  K 3.4* 4.4 3.6  CL 107 110 110  CO2 24 26 26   GLUCOSE 99 120* 102*  BUN 14 9 13    CREATININE 0.87 0.84 0.90  CALCIUM 9.0 8.6* 8.4*   Liver Function Tests: Recent Labs  Lab 05/03/22 0326 05/04/22 0446 05/05/22 0627  AST 31 134* 110*  ALT 79* 195* 203*  ALKPHOS 91 90 81  BILITOT 0.7 0.9 0.6  PROT 8.0 7.1 6.2*  ALBUMIN 4.4 3.7 3.3*   CBG: No results for input(s): "GLUCAP" in the last 168 hours.  Discharge time spent: greater than 30 minutes.  Signed: 05/06/22, MD [facility] 05/05/2022

## 2022-05-05 NOTE — Discharge Instructions (Signed)
In addition to included general post-operative instructions,  Diet: Resume home diet. Recommend avoiding or limiting fatty/greasy foods over the next few days/week. If you do eat these, you may (or may not) notice diarrhea. This is expected while your body adjusts to not having a gallbladder, and it typically resolves with time.    Activity: No heavy lifting >20 pounds (children, pets, laundry, garbage) or strenuous activity for 4 weeks, but light activity and walking are encouraged. Do not drive or drink alcohol if taking narcotic pain medications or having pain that might distract from driving.  Wound care: Keep right sided dressing on for 48 hours; this is water proof. You may shower/get incision wet with soapy water and pat dry (do not rub incisions), but no baths or submerging incision underwater until follow-up.   Medications: Resume all home medications. For mild to moderate pain: acetaminophen (Tylenol) or ibuprofen/naproxen (if no kidney disease). Combining Tylenol with alcohol can substantially increase your risk of causing liver disease. Narcotic pain medications, if prescribed, can be used for severe pain, though may cause nausea, constipation, and drowsiness. Do not combine Tylenol and Percocet (or similar) within a 6 hour period as Percocet (and similar) contain(s) Tylenol. If you do not need the narcotic pain medication, you do not need to fill the prescription.  Call office [phone]) at any time if any questions, worsening pain, fevers/chills, bleeding, drainage from incision site, or other concerns.

## 2022-05-05 NOTE — Progress Notes (Signed)
Mobility Specialist - Progress Note    05/05/22 0900  Mobility  Activity Ambulated independently in hallway  Level of Assistance Independent  Assistive Device None  Distance Ambulated (ft) 320 ft  Activity Response Tolerated well  $Mobility charge 1 Mobility    Pt sitting EOB upon arrival using RA. Completes all activities indep and voices only 3/10 pain this date. Pt returns EOB with RN present. Tolerates well and is left with needs in reach.  Clarisa Schools Mobility Specialist 05/05/22, 10:00 AM

## 2022-05-17 ENCOUNTER — Other Ambulatory Visit: Payer: Self-pay

## 2022-05-17 ENCOUNTER — Encounter: Payer: Self-pay | Admitting: Physician Assistant

## 2022-05-17 ENCOUNTER — Ambulatory Visit (INDEPENDENT_AMBULATORY_CARE_PROVIDER_SITE_OTHER): Payer: Self-pay | Admitting: Physician Assistant

## 2022-05-17 VITALS — BP 121/86 | HR 88 | Temp 97.9°F | Ht 72.0 in | Wt 287.0 lb

## 2022-05-17 DIAGNOSIS — K8000 Calculus of gallbladder with acute cholecystitis without obstruction: Secondary | ICD-10-CM

## 2022-05-17 DIAGNOSIS — Z09 Encounter for follow-up examination after completed treatment for conditions other than malignant neoplasm: Secondary | ICD-10-CM

## 2022-05-17 NOTE — Patient Instructions (Signed)

## 2022-05-17 NOTE — Progress Notes (Signed)
[facility] POST-OP OFFICE VISIT  05/17/2022  HPI: Jackson Jimenez is a 31 y.o. male 14 days s/p robotic assisted laparoscopic cholecystectomy for acute cholecystitis with Dr Everlene Farrier   He is doing well No abdominal pain, nausea, emesis, or diarrhea No issues with incisions Tolerating PO without issues No other complaints   Vital signs: BP 121/86   Pulse 88   Temp 97.9 F (36.6 C) (Oral)   Ht 6' (1.829 m)   Wt 287 lb (130.2 kg)   SpO2 96%   BMI 38.92 kg/m    Physical Exam: Constitutional: Well appearing male, NAD Abdomen: Soft, non-tender, non-distended, no rebound/guarding Skin: Laparoscopic incisions are healing well, no erythema or drainage   Assessment/Plan: This is a 31 y.o. male 14 days s/p robotic assisted laparoscopic cholecystectomy for acute cholecystitis with Dr Everlene Farrier    - Pain control prn  - Reviewed wound care recommendation  - Reviewed lifting restrictions; return to work note given   - Reviewed surgical pathology; Acute on chronic cholecystitis   - He can follow up on as needed basis; He understands to call with questions/concerns  -- Lynden Oxford, PA-C [facility] 05/17/2022, 2:19 PM M-F: 7am - 4pm

## 2022-07-06 IMAGING — CR DG CHEST 2V
1 series · 2 of 2 positions shown · non-contrast
Comparison: 08/27/2020

CLINICAL DATA: Left-sided chest pressure

EXAM:
CHEST - 2 VIEW

[Series 1: dg chest 2 view · 0.14mm/px · 2 of 2 slices shown]
[im 1/2]
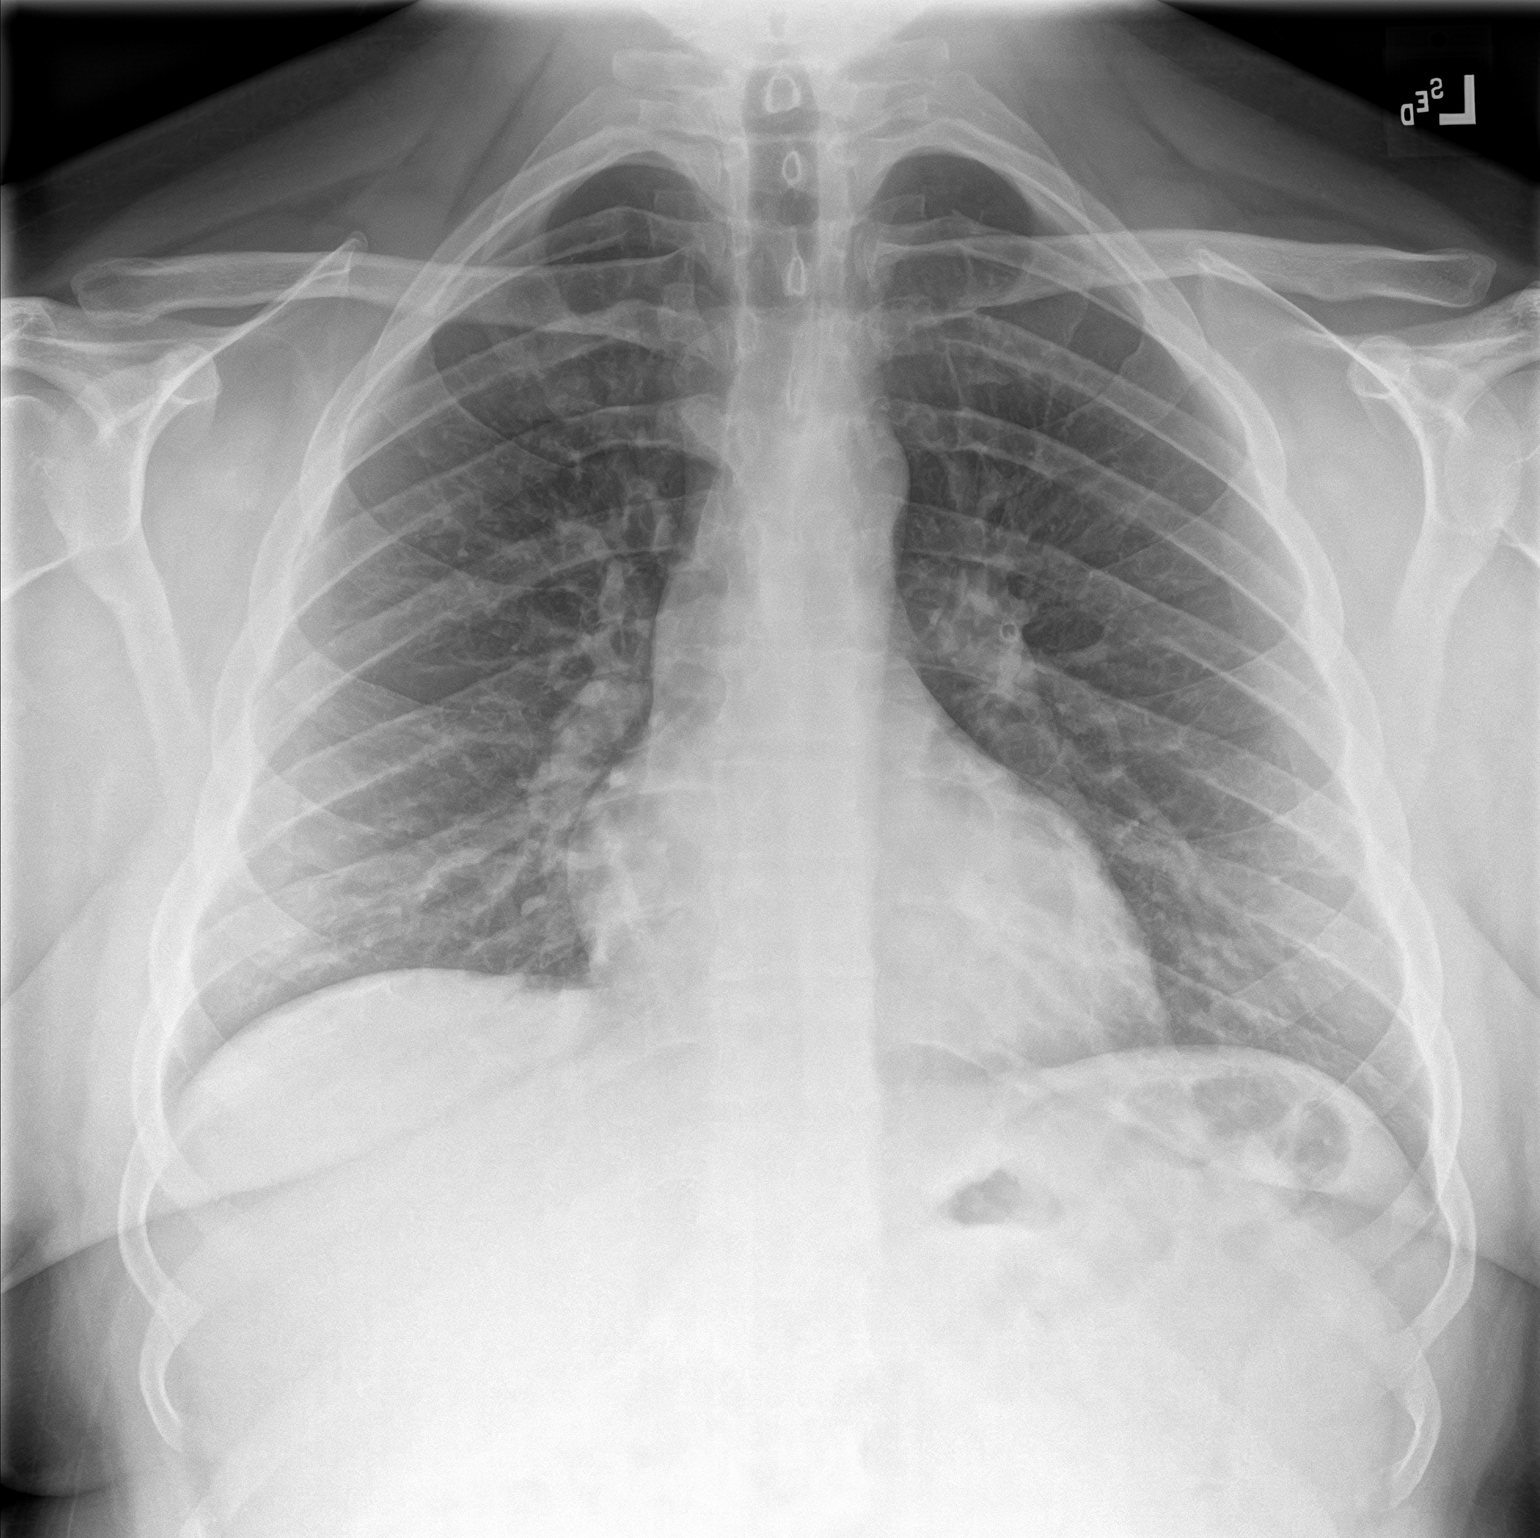
[im 2/2]
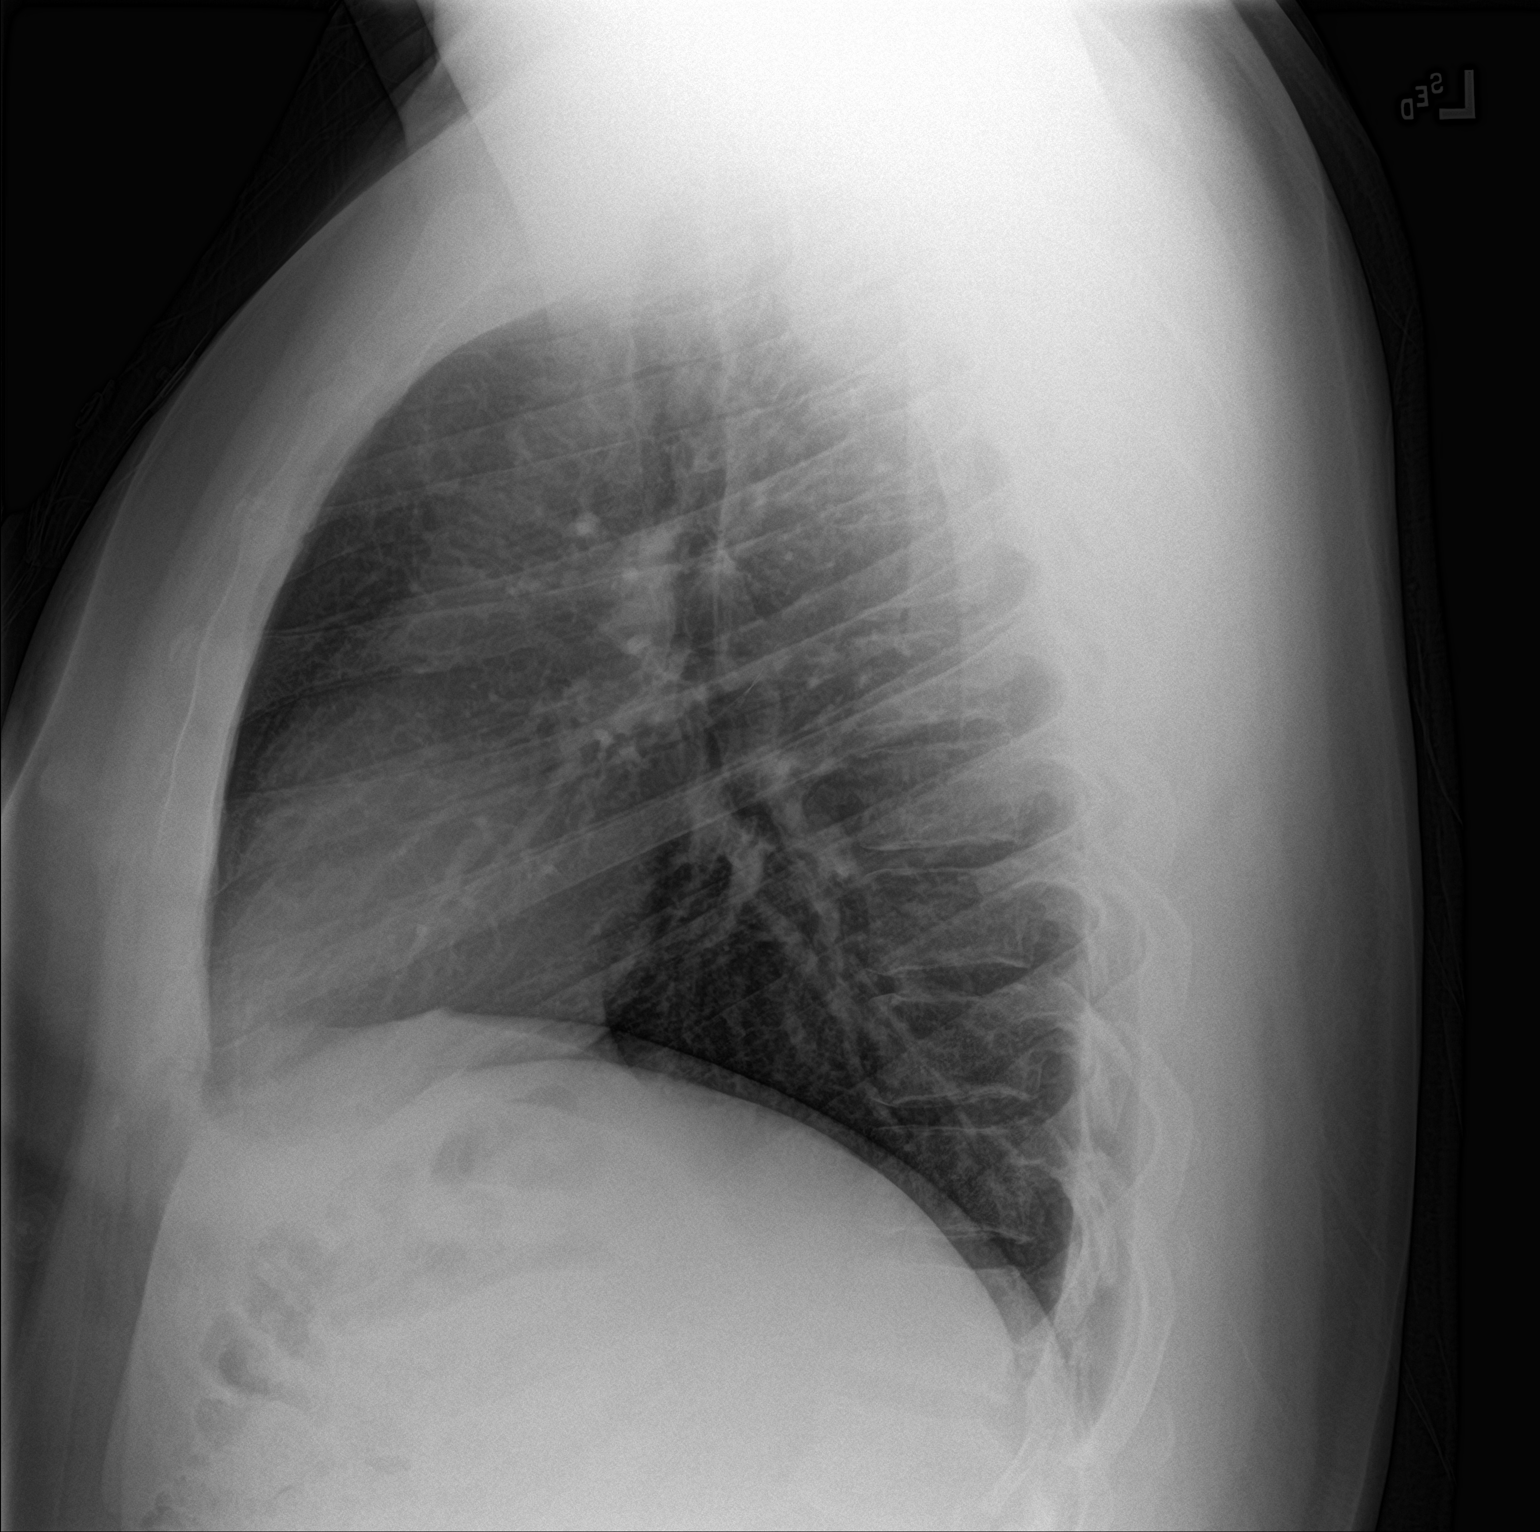

[2 of 2 positions shown; findings below may reference images not displayed]

FINDINGS: The heart size and mediastinal contours are within normal limits.
Mildly increased bibasilar interstitial markings. No focal airspace
consolidation. No pleural effusion or pneumothorax. The visualized
skeletal structures are unremarkable.
IMPRESSION: Mildly increased bibasilar interstitial markings, which may reflect
bronchitic type lung changes. No focal airspace consolidation.

## 2022-09-07 IMAGING — CR DG CHEST 2V
1 series · 2 of 2 positions shown · non-contrast
Comparison: 06/23/2021

CLINICAL DATA: Right-sided pain with twisting injury. Evaluate for
pneumothorax.

EXAM:
CHEST - 2 VIEW

[Series 1: dg chest 2 view · 0.14mm/px · 2 of 2 slices shown]
[im 1/2]
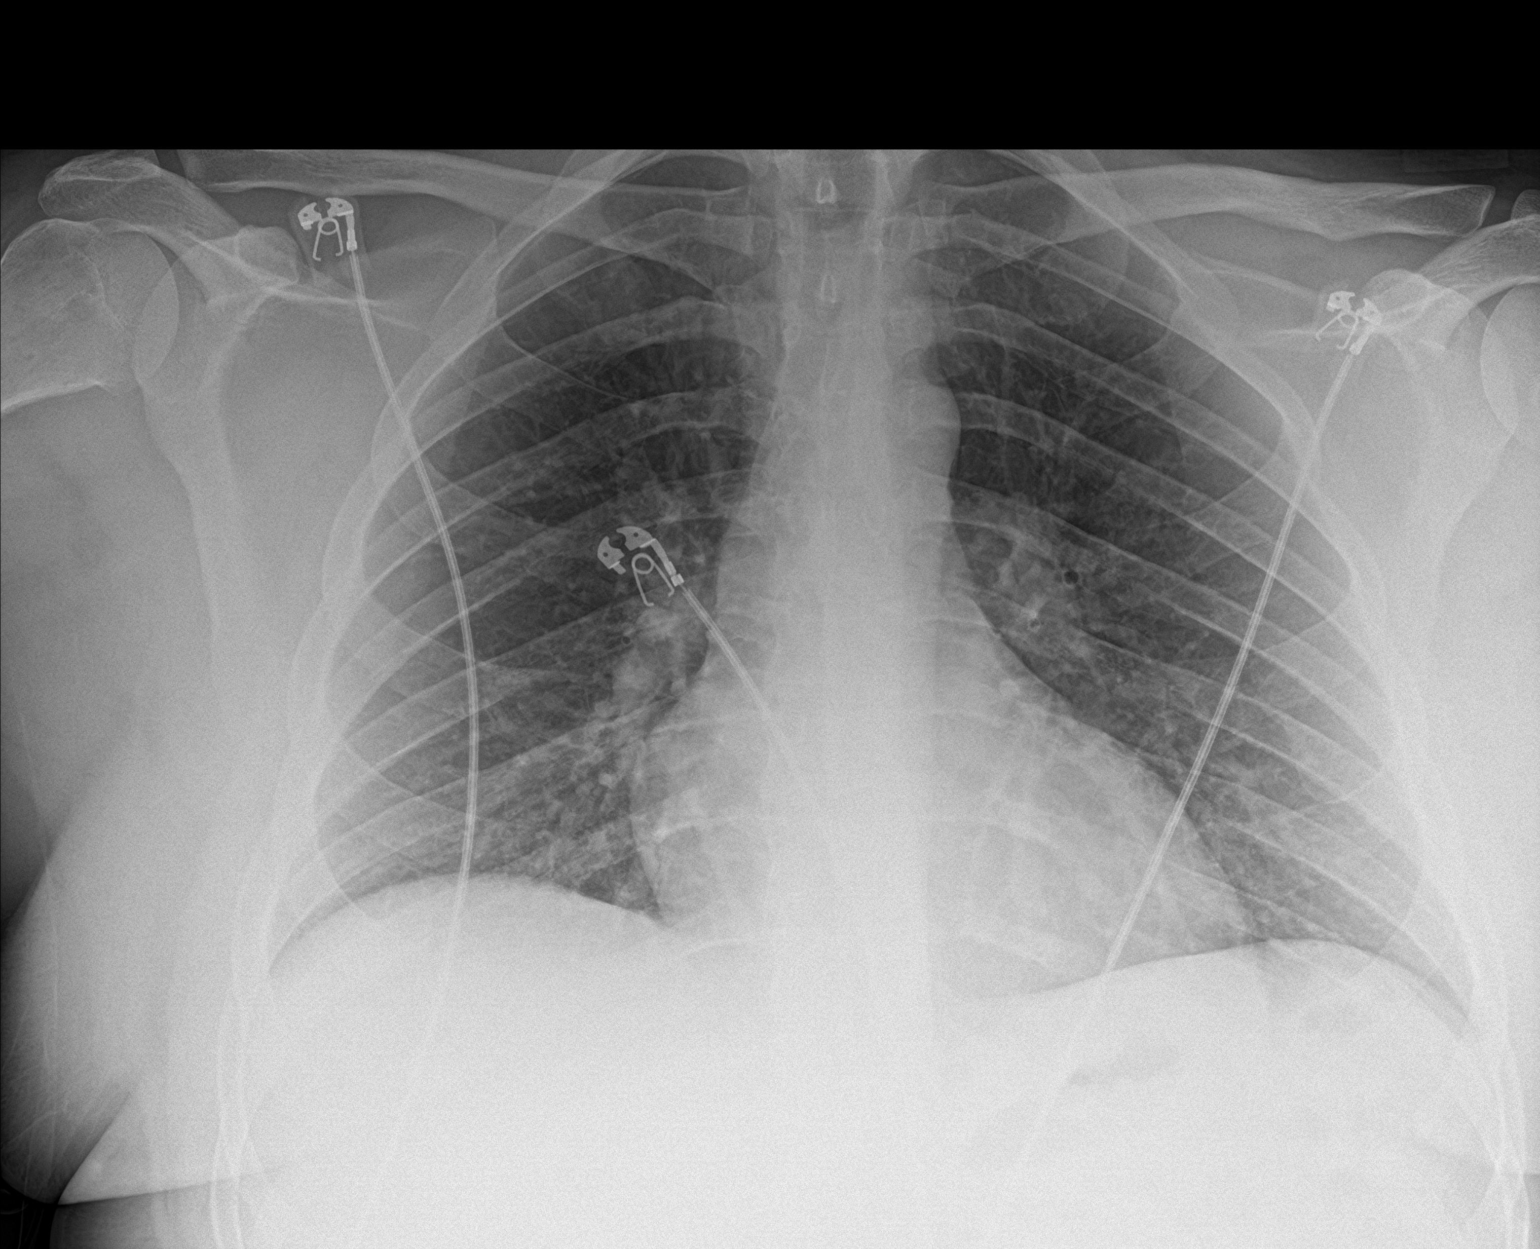
[im 2/2]
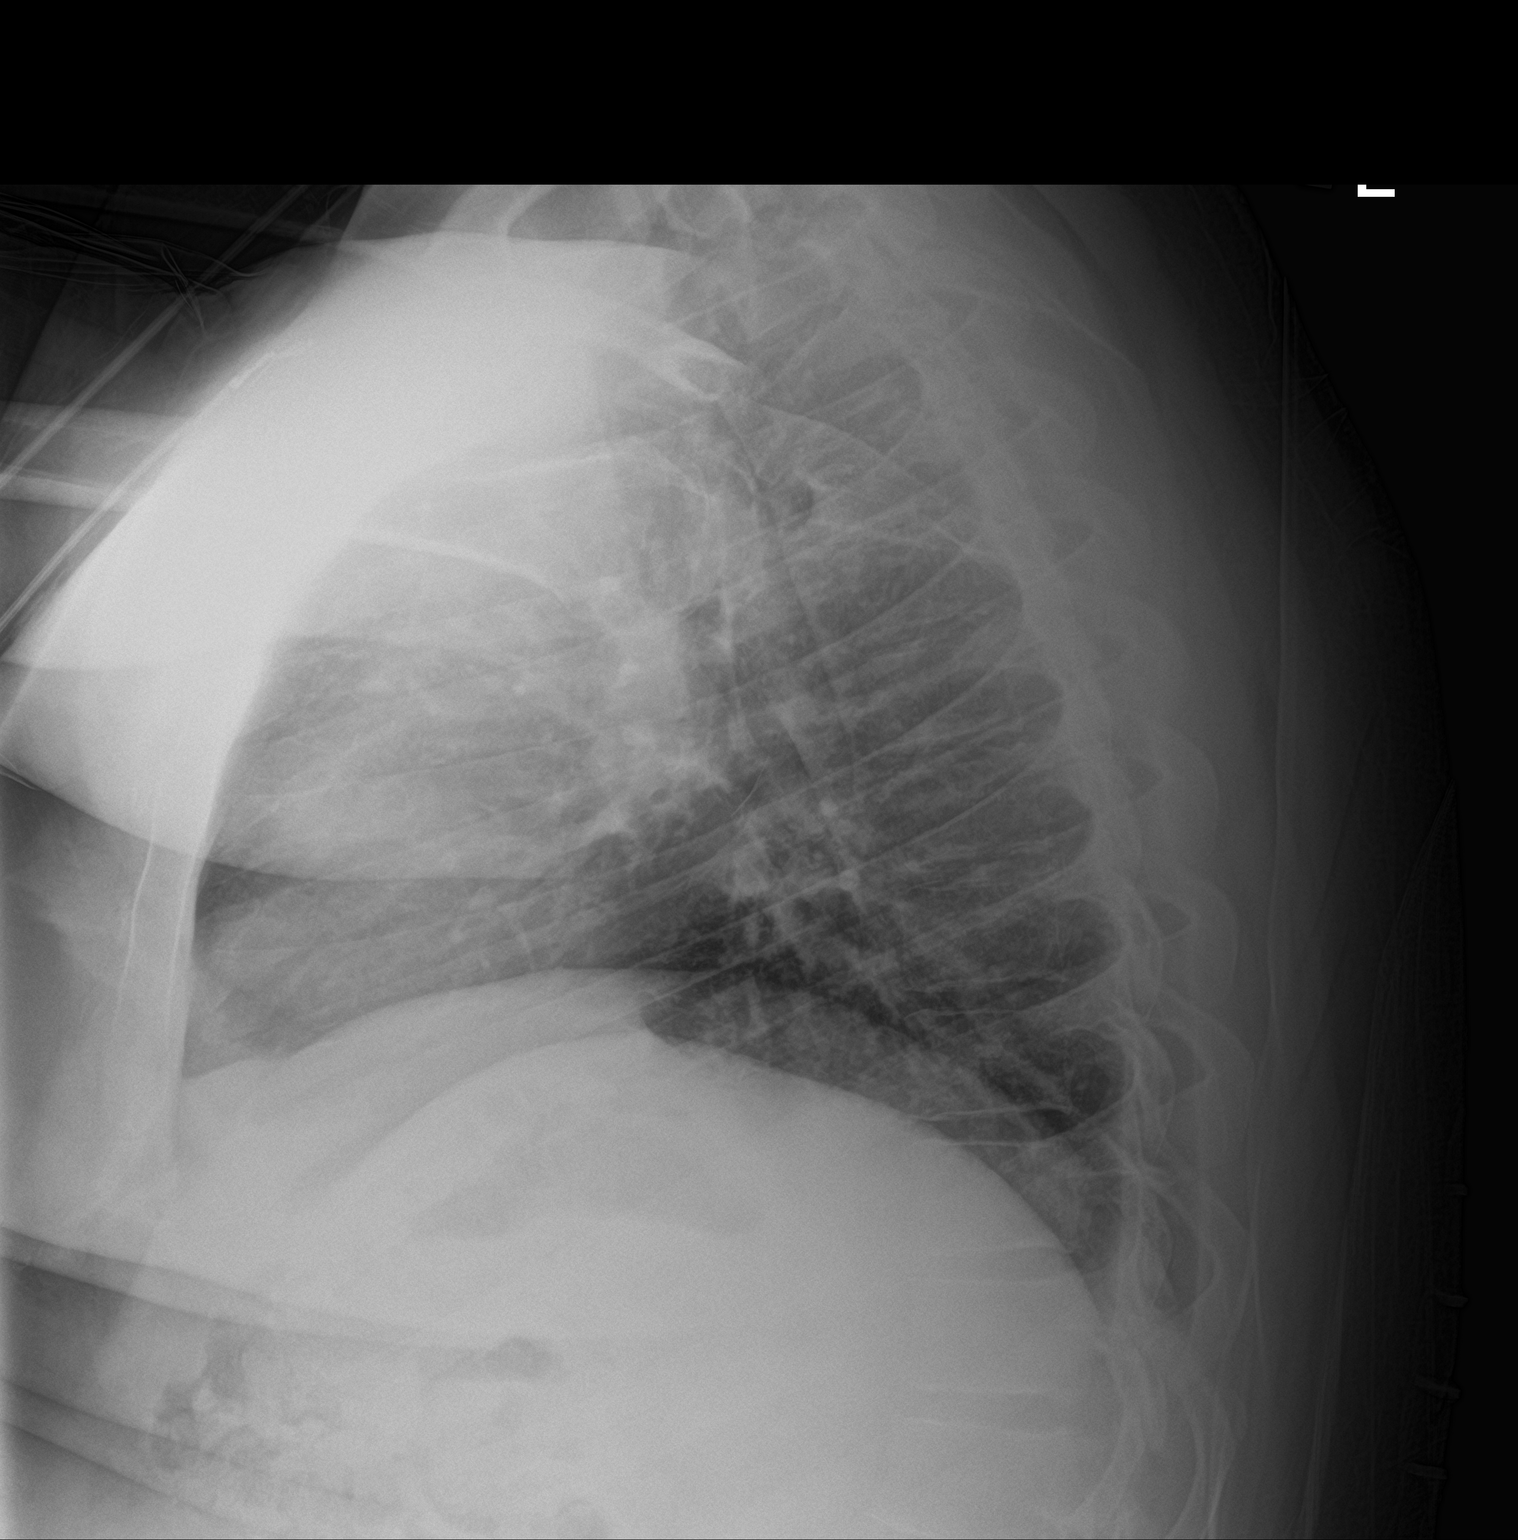

[2 of 2 positions shown; findings below may reference images not displayed]

FINDINGS: Numerous leads and wires project over the chest.

Midline trachea.  Normal heart size and mediastinal contours.

Sharp costophrenic angles.  No pneumothorax.  Clear lungs.
IMPRESSION: No active cardiopulmonary disease.

## 2023-06-13 ENCOUNTER — Other Ambulatory Visit: Payer: Self-pay

## 2023-06-13 ENCOUNTER — Emergency Department: Payer: 59

## 2023-06-13 ENCOUNTER — Emergency Department
Admission: EM | Admit: 2023-06-13 | Discharge: 2023-06-13 | Disposition: A | Discharge status: Home / Self Care | Payer: 59 | Attending: Emergency Medicine | Admitting: Emergency Medicine

## 2023-06-13 DIAGNOSIS — S8992XA Unspecified injury of left lower leg, initial encounter: Secondary | ICD-10-CM | POA: Diagnosis present

## 2023-06-13 DIAGNOSIS — S83412A Sprain of medial collateral ligament of left knee, initial encounter: Secondary | ICD-10-CM | POA: Diagnosis not present

## 2023-06-13 DIAGNOSIS — S82242D Displaced spiral fracture of shaft of left tibia, subsequent encounter for closed fracture with routine healing: Secondary | ICD-10-CM | POA: Diagnosis not present

## 2023-06-13 DIAGNOSIS — S83242D Other tear of medial meniscus, current injury, left knee, subsequent encounter: Secondary | ICD-10-CM

## 2023-06-13 DIAGNOSIS — K2900 Acute gastritis without bleeding: Secondary | ICD-10-CM | POA: Insufficient documentation

## 2023-06-13 DIAGNOSIS — X501XXA Overexertion from prolonged static or awkward postures, initial encounter: Secondary | ICD-10-CM | POA: Diagnosis not present

## 2023-06-13 LAB — BASIC METABOLIC PANEL
Anion gap: 10 (ref 5–15)
BUN: 17 mg/dL (ref 6–20)
CO2: 26 mmol/L (ref 22–32)
Calcium: 8.9 mg/dL (ref 8.9–10.3)
Chloride: 103 mmol/L (ref 98–111)
Creatinine, Ser: 0.84 mg/dL (ref 0.61–1.24)
GFR, Estimated: >60 mL/min (ref >60)
Glucose, Bld: 106 mg/dL — ABNORMAL HIGH (ref 70–99)
Potassium: 3.8 mmol/L (ref 3.5–5.1)
Sodium: 139 mmol/L (ref 135–145)

## 2023-06-13 LAB — CBC
HCT: 48 % (ref 39.0–52.0)
Hemoglobin: 16 g/dL (ref 13.0–17.0)
MCH: 28 pg (ref 26.0–34.0)
MCHC: 33.3 g/dL (ref 30.0–36.0)
MCV: 84.1 fL (ref 80.0–100.0)
Platelets: 229 10*3/uL (ref 150–400)
RBC: 5.71 MIL/uL (ref 4.22–5.81)
RDW: 13 % (ref 11.5–15.5)
WBC: 8.3 10*3/uL (ref 4.0–10.5)
nRBC: 0 % (ref 0.0–0.2)

## 2023-06-13 MED ORDER — ALUM & MAG HYDROXIDE-SIMETH 200-200-20 MG/5ML PO SUSP
30.0000 mL | Freq: Once | ORAL | Status: AC
  Administered 2023-06-13: 30 mL via ORAL
  Filled 2023-06-13: qty 30

## 2023-06-13 MED ORDER — PREDNISONE 20 MG PO TABS: 40.0000 mg | ORAL_TABLET | Freq: Every day | ORAL | 0 refills | Status: AC

## 2023-06-13 MED ORDER — HYDROCODONE-ACETAMINOPHEN 5-325 MG PO TABS
2.0000 | ORAL_TABLET | Freq: Once | ORAL | Status: AC
  Administered 2023-06-13: 2 via ORAL
  Filled 2023-06-13: qty 2

## 2023-06-13 MED ORDER — PANTOPRAZOLE SODIUM 40 MG PO TBEC: 40.0000 mg | DELAYED_RELEASE_TABLET | Freq: Every day | ORAL | 0 refills | Status: AC

## 2023-06-13 MED ORDER — ONDANSETRON 4 MG PO TBDP
4.0000 mg | ORAL_TABLET | Freq: Once | ORAL | Status: AC
  Administered 2023-06-13: 4 mg via ORAL
  Filled 2023-06-13: qty 1

## 2023-06-13 MED ORDER — HYDROCODONE-ACETAMINOPHEN 5-325 MG PO TABS: 1.0000 | ORAL_TABLET | Freq: Four times a day (QID) | ORAL | 0 refills | Status: AC | PRN

## 2023-06-13 NOTE — ED Provider Notes (Signed)
[facility] Provider Note    Event Date/Time   First MD Initiated Contact with Patient 06/13/23 1754     (approximate)   History   Knee Pain and Shortness of Breath   HPI  Jackson Jimenez is a 32 y.o. male here with knee pain.  The patient states that about a month ago, he twisted his knee and felt something pop.  Since then, he has had aching, throbbing, severe, medial knee pain.  He was seen and diagnosed with possible meniscal injury.  Is been in a brace since then.  He has had ongoing pain.  He does admit that he has not been staying off it as much as he should.  Earlier today, he also noticed some shortness of breath when he took diclofenac.  He has been on this since the injury and is also been taking ibuprofen.  He said some mild increase in indigestion and heartburn.  No vomiting.  No chest pain.  The pain is persistent and limiting his ability to work.  He has not seen orthopedist.     Physical Exam   Triage Vital Signs: ED Triage Vitals  Encounter Vitals Group     BP 06/13/23 1502 (!) 152/84     Systolic BP Percentile --      Diastolic BP Percentile --      Pulse Rate 06/13/23 1502 75     Resp 06/13/23 1502 18     Temp 06/13/23 1502 98 F (36.7 C)     Temp src --      SpO2 06/13/23 1502 100 %     Weight --      Height --      Head Circumference --      Peak Flow --      Pain Score 06/13/23 1500 10     Pain Loc --      Pain Education --      Exclude from Growth Chart --     Most recent vital signs: Vitals:   06/13/23 1800 06/13/23 1900  BP: 125/82 (!) 131/93  Pulse: 60 64  Resp: 18 18  Temp:    SpO2: 99% 99%     General: Awake, no distress.  CV:  Good peripheral perfusion.  Regular rate and rhythm. Resp:  Normal work of breathing.  Lungs clear to auscultation bilaterally.  No wheezing. Abd:  No distention.  No tenderness. Other:  Significant tenderness over the medial aspect of the right knee, with posterior joint line  tenderness as well.  Slight clicking and tenderness with inward rotation of the knee.   ED Results / Procedures / Treatments   Labs (all labs ordered are listed, but only abnormal results are displayed) Labs Reviewed  BASIC METABOLIC PANEL - Abnormal; Notable for the following components:      Result Value   Glucose, Bld 106 (*)    All other components within normal limits  CBC     EKG Normal sinus rhythm, ventricular at 78.  PR 152, QRS 108, QTc 430 separate no acute ST elevation or depression] of acute ischemia or infarct.   RADIOLOGY Chest x-ray: No active disease   I also independently reviewed and agree with radiologist interpretations.   PROCEDURES:  Critical Care performed: No   MEDICATIONS ORDERED IN ED: Medications  alum & mag hydroxide-simeth (MAALOX/MYLANTA) 200-200-20 MG/5ML suspension 30 mL (30 mLs Oral Given 06/13/23 1843)  HYDROcodone-acetaminophen (NORCO/VICODIN) 5-325 MG per tablet 2 tablet (2 tablets  Oral Given 06/13/23 1843)  ondansetron (ZOFRAN-ODT) disintegrating tablet 4 mg (4 mg Oral Given 06/13/23 1843)     IMPRESSION / MDM / ASSESSMENT AND PLAN / ED COURSE  I reviewed the triage vital signs and the nursing notes.                              Differential diagnosis includes, but is not limited to, likely MCL and possible medial meniscus injury, knee sprain, musculoskeletal pain, lumbar radiculopathy, shortness of breath likely secondary to acid reflux, anxiety, medication adverse effect, atypical allergy, atypical asthma presentation  Patient's presentation is most consistent with acute presentation with potential threat to life or bodily function.  The patient is on the cardiac monitor to evaluate for evidence of arrhythmia and/or significant heart rate changes   32 yo M here with knee pain and transient SOB. Re: his knee pain - he has what appears to be a meniscus/MCL injury on exam. I discussed need for Ortho f/u and will give him additional  analgesia for this. He has no unilateral leg swelling or signs to suggest DVT. Distal NV is intact. Re: his SOB, this occurred briefly after taking his diclofenac and I suspect he has a mild gastritis/GERD, versus atypical allergic reaction to th emed. He has no ongoing sx now. He has had increasing indigestion and has been on ibuprofen and diclofenac. CBC without leukocytosis or anemia. BMP unremarkable. EKG nonischemic and CXR is clear. No CP or signs of ACS.   Will d/c diclofenac, give him a brief course of analgesia and steroids for his knee inflammation. Will have him start prophylactic PPI. He has no hypoxia, is PERC negative, no signs of PE clinically.   FINAL CLINICAL IMPRESSION(S) / ED DIAGNOSES   Final diagnoses:  Sprain of medial collateral ligament of left knee, initial encounter  Tear of medial meniscus of left knee, current, unspecified tear type, subsequent encounter  Acute superficial gastritis without hemorrhage     Rx / DC Orders   ED Discharge Orders          Ordered    predniSONE (DELTASONE) 20 MG tablet  Daily        06/13/23 1844    HYDROcodone-acetaminophen (NORCO/VICODIN) 5-325 MG tablet  Every 6 hours PRN        06/13/23 1844    pantoprazole (PROTONIX) 40 MG tablet  Daily        06/13/23 1844             Note:  This document was prepared using Dragon voice recognition software and may include unintentional dictation errors.   Shaune Pollack, MD 06/13/23 2233

## 2023-06-13 NOTE — Discharge Instructions (Addendum)
For your knee:  Continue the brace NO activity more than walking for 1 week START taking the prednisone.  STOP the diclofenac and ibuprofen. START the Hydrocodone for severe pain START the Protonix to protect your stomach  IF you are still having pain after 1 week, call [facility] for follow-up with a knee specialist

## 2023-06-13 NOTE — ED Triage Notes (Addendum)
Pt comes with c/o right knee pain. Pt states he was seen and given meds but pain has gotten worse. Pt not sure if he is having a reaction to the medicine. Pt states little trouble breathing. Pt states it varies with his sob. Pt states he has been laying around since injuring his knee.

## 2023-06-13 NOTE — ED Notes (Signed)
Pt verbalizes understanding of discharge instructions. Opportunity for questioning and answers were provided. Pt discharged from ED to home with aunt.

## 2024-03-29 ENCOUNTER — Encounter (HOSPITAL_COMMUNITY): Payer: Self-pay | Admitting: *Deleted

## 2024-03-29 ENCOUNTER — Other Ambulatory Visit: Payer: Self-pay

## 2024-03-29 ENCOUNTER — Emergency Department (HOSPITAL_COMMUNITY)
Admission: EM | Admit: 2024-03-29 | Discharge: 2024-03-29 | Disposition: A | Discharge status: Home / Self Care | Payer: Self-pay

## 2024-03-29 DIAGNOSIS — B029 Zoster without complications: Secondary | ICD-10-CM | POA: Insufficient documentation

## 2024-03-29 MED ORDER — VALACYCLOVIR HCL 1 G PO TABS: 1000.0000 mg | ORAL_TABLET | Freq: Three times a day (TID) | ORAL | 0 refills | Status: AC

## 2024-03-29 NOTE — ED Triage Notes (Signed)
 States 2 days ago had a dot under hsi right eye yest noticed a rash today right eye is swollen.

## 2024-03-29 NOTE — ED Provider Notes (Signed)
 [place] [facility] AT [facility] Provider Note   CSN: [id number] Arrival date & time: 03/29/24  9171     Patient presents with: Eye Pain   Jackson Jimenez is a 33 y.o. male.   33 year old male presents for evaluation of rash to his face.  States is located just under his eye.  States has been going for 2 days as burning in nature.  States he has noticed some eyelid swelling as well.  Denies any pain with eye movements, nausea, fevers, or any other symptoms or concerns at this time.  He has had no trouble with his vision.   Eye Pain Pertinent negatives include no chest pain, no abdominal pain and no shortness of breath.       Prior to Admission medications   Medication Sig Start Date End Date Taking? Authorizing Provider  valACYclovir (VALTREX) 1000 MG tablet Take 1 tablet (1,000 mg total) by mouth 3 (three) times daily for 14 days. 03/29/24 04/12/24 Yes Alyssa Mancera L, DO  HYDROcodone -acetaminophen  (NORCO/VICODIN) 5-325 MG tablet Take 1-2 tablets by mouth every 6 (six) hours as needed for moderate pain or severe pain (no more than 6 tabs daily). 06/13/23 06/12/24  Angelena Smalls, MD  pantoprazole  (PROTONIX ) 40 MG tablet Take 1 tablet (40 mg total) by mouth daily for 7 days. For stomach inflammation/gastritis from medications 06/13/23 06/20/23  Angelena Smalls, MD    Allergies: Albuterol  and Diclofenac     Review of Systems  Constitutional:  Negative for chills and fever.  HENT:  Negative for ear pain and sore throat.   Eyes:  Positive for pain. Negative for visual disturbance.  Respiratory:  Negative for cough and shortness of breath.   Cardiovascular:  Negative for chest pain and palpitations.  Gastrointestinal:  Negative for abdominal pain and vomiting.  Genitourinary:  Negative for dysuria and hematuria.  Musculoskeletal:  Negative for arthralgias and back pain.  Skin:  Negative for color change and rash.  Neurological:  Negative for seizures and  syncope.  All other systems reviewed and are negative.   Updated Vital Signs BP (!) 143/100 (BP Location: Left Arm)   Pulse 71   Temp 98.8 F (37.1 C)   Resp 18   Ht 6' (1.829 m)   Wt 113.4 kg   SpO2 100%   BMI 33.91 kg/m   Physical Exam Vitals and nursing note reviewed.  Constitutional:      General: He is not in acute distress.    Appearance: Normal appearance. He is well-developed. He is not ill-appearing.  HENT:     Head: Normocephalic and atraumatic.  Eyes:     Extraocular Movements: Extraocular movements intact.     Conjunctiva/sclera: Conjunctivae normal.     Pupils: Pupils are equal, round, and reactive to light.     Comments: Eyes not red, there are no visual deficits and no abnormalities of the eye, the rash is located on the skin underneath the eye but he does have some mild swelling of his eyelid  Cardiovascular:     Rate and Rhythm: Normal rate and regular rhythm.     Heart sounds: No murmur heard. Pulmonary:     Effort: Pulmonary effort is normal. No respiratory distress.     Breath sounds: Normal breath sounds.  Abdominal:     Palpations: Abdomen is soft.     Tenderness: There is no abdominal tenderness.  Musculoskeletal:        General: No swelling.  Cervical back: Neck supple.  Skin:    General: Skin is warm and dry.     Capillary Refill: Capillary refill takes less than 2 seconds.     Comments: Rash consistent with shingles just underneath right eye on the skin  Neurological:     Mental Status: He is alert.  Psychiatric:        Mood and Affect: Mood normal.     (all labs ordered are listed, but only abnormal results are displayed) Labs Reviewed - No data to display  EKG: None  Radiology: No results found.   Procedures   Medications Ordered in the ED - No data to display                                  Medical Decision Making Patient with evidence of shingles on the right side of the face.  There is no abnormality of the eye he  just has some eyelid swelling.  No pain with eye movements.  Will start him on valacyclovir.  Advise close follow-up with primary care doctor and ophthalmology and to return for any new or worsening symptoms.  Given strict return precaution should he develop vision changes or any changes in his eye.  He feels comfortable being discharged home.  Vies Tylenol  Motrin  as needed for pain.  Problems Addressed: Herpes zoster without complication: acute illness or injury  Amount and/or Complexity of Data Reviewed External Data Reviewed: notes.    Details: Prior ED records reviewed and patient was seen in October 2024 for sprain of his MCL  Risk OTC drugs. Prescription drug management.    Final diagnoses:  Herpes zoster without complication    ED Discharge Orders          Ordered    valACYclovir (VALTREX) 1000 MG tablet  3 times daily        03/29/24 0909               Gennaro Bouchard L, DO 03/29/24 1034

## 2024-03-29 NOTE — Discharge Instructions (Signed)
 You can take Tylenol  and Motrin  as needed for pain.  Follow-up with ophthalmology.  Call office to make an appointment in 1 to 2 weeks.  Return if your symptoms worsen.

## 2024-06-04 ENCOUNTER — Emergency Department
Admission: EM | Admit: 2024-06-04 | Discharge: 2024-06-04 | Disposition: A | Discharge status: Home / Self Care | Payer: MEDICAID | Attending: Emergency Medicine | Admitting: Emergency Medicine

## 2024-06-04 ENCOUNTER — Other Ambulatory Visit: Payer: Self-pay

## 2024-06-04 ENCOUNTER — Emergency Department: Payer: MEDICAID

## 2024-06-04 DIAGNOSIS — J069 Acute upper respiratory infection, unspecified: Secondary | ICD-10-CM | POA: Insufficient documentation

## 2024-06-04 DIAGNOSIS — H6992 Unspecified Eustachian tube disorder, left ear: Secondary | ICD-10-CM | POA: Diagnosis not present

## 2024-06-04 DIAGNOSIS — R0781 Pleurodynia: Secondary | ICD-10-CM | POA: Insufficient documentation

## 2024-06-04 DIAGNOSIS — B9789 Other viral agents as the cause of diseases classified elsewhere: Secondary | ICD-10-CM | POA: Insufficient documentation

## 2024-06-04 DIAGNOSIS — R059 Cough, unspecified: Secondary | ICD-10-CM | POA: Diagnosis present

## 2024-06-04 LAB — CBC
HCT: 47.6 % (ref 39.0–52.0)
Hemoglobin: 16.1 g/dL (ref 13.0–17.0)
MCH: 28.3 pg (ref 26.0–34.0)
MCHC: 33.8 g/dL (ref 30.0–36.0)
MCV: 83.7 fL (ref 80.0–100.0)
Platelets: 256 K/uL (ref 150–400)
RBC: 5.69 MIL/uL (ref 4.22–5.81)
RDW: 12.9 % (ref 11.5–15.5)
WBC: 11.4 K/uL — ABNORMAL HIGH (ref 4.0–10.5)
nRBC: 0 % (ref 0.0–0.2)

## 2024-06-04 LAB — BASIC METABOLIC PANEL WITH GFR
Anion gap: 11 (ref 5–15)
BUN: 14 mg/dL (ref 6–20)
CO2: 23 mmol/L (ref 22–32)
Calcium: 9.3 mg/dL (ref 8.9–10.3)
Chloride: 106 mmol/L (ref 98–111)
Creatinine, Ser: 0.96 mg/dL (ref 0.61–1.24)
GFR, Estimated: >60 mL/min (ref >60)
Glucose, Bld: 99 mg/dL (ref 70–99)
Potassium: 4 mmol/L (ref 3.5–5.1)
Sodium: 140 mmol/L (ref 135–145)

## 2024-06-04 MED ORDER — FLUTICASONE PROPIONATE 50 MCG/ACT NA SUSP: 1.0000 | Freq: Every day | NASAL | 0 refills | Status: AC

## 2024-06-04 MED ORDER — LIDOCAINE 5 % EX PTCH
1.0000 | MEDICATED_PATCH | CUTANEOUS | Status: DC
  Administered 2024-06-04: 1 via TRANSDERMAL
  Filled 2024-06-04: qty 1

## 2024-06-04 MED ORDER — ONDANSETRON 4 MG PO TBDP
4.0000 mg | ORAL_TABLET | Freq: Once | ORAL | Status: AC
  Administered 2024-06-04: 4 mg via ORAL
  Filled 2024-06-04: qty 1

## 2024-06-04 MED ORDER — LIDOCAINE 5 % EX PTCH
1.0000 | MEDICATED_PATCH | Freq: Two times a day (BID) | CUTANEOUS | 0 refills | Status: AC
Start: 2024-06-04 — End: 2024-06-09

## 2024-06-04 NOTE — Discharge Instructions (Signed)
 Please follow-up with ENT if your symptoms persist.  You may also take the medications as prescribed to help with your symptoms.  Please return for any new, worsening, or change in symptoms or other concerns.  It was a pleasure caring for you today.

## 2024-06-04 NOTE — ED Triage Notes (Signed)
 Pt to ED for dizziness, shob, feeling like water in my ears, right sided rib cage pain, cough for a few weeks. Reports thinks its from living in basement.

## 2024-06-04 NOTE — ED Provider Notes (Signed)
 [facility] Provider Note    Event Date/Time   First MD Initiated Contact with Patient 06/04/24 1344     (approximate)   History   Dizziness, Shortness of Breath, and multiple complaints   HPI  Jackson Jimenez is a 33 y.o. male who presents today for evaluation of left ear discomfort, cough, nasal congestion, dizziness, and rib pain when coughing.  Patient reports that the symptoms have been ongoing for 1 to 2 weeks.  He reports that his chest pain hurts only when coughing, not at rest.  He denies any substernal or central chest pain.  He reports that he does not feel short of breath.  He does not have any lower extremity swelling.  No history of PE or DVT.  No hemoptysis.  He reports that he recently moved into a new apartment which has had mold in it.  He denies any other new exposures.  He is unsure of any sick contacts.  Patient Active Problem List   Diagnosis Date Noted   Abdominal pain 05/04/2022   Cholelithiasis with acute cholecystitis 05/03/2022   Hypokalemia 05/03/2022   Obesity, Class III, BMI 40-49.9 (morbid obesity) 05/03/2022          Physical Exam   Triage Vital Signs: ED Triage Vitals  Encounter Vitals Group     BP 06/04/24 1202 (!) 132/96     Girls Systolic BP Percentile --      Girls Diastolic BP Percentile --      Boys Systolic BP Percentile --      Boys Diastolic BP Percentile --      Pulse Rate 06/04/24 1202 85     Resp 06/04/24 1202 20     Temp 06/04/24 1202 98.8 F (37.1 C)     Temp src --      SpO2 06/04/24 1202 99 %     Weight 06/04/24 1203 261 lb (118.4 kg)     Height 06/04/24 1203 6' (1.829 m)     Head Circumference --      Peak Flow --      Pain Score 06/04/24 1203 5     Pain Loc --      Pain Education --      Exclude from Growth Chart --     Most recent vital signs: Vitals:   06/04/24 1202  BP: (!) 132/96  Pulse: 85  Resp: 20  Temp: 98.8 F (37.1 C)  SpO2: 99%    Physical Exam Vitals and nursing  note reviewed.  Constitutional:      General: Awake and alert. No acute distress.    Appearance: Normal appearance. The patient is normal weight.  HENT:     Head: Normocephalic and atraumatic.     Mouth: Mucous membranes are moist.  Nasal congestion present Serous effusion noted to left ear, no purulent effusion.  No bulging or erythematous tympanic membrane, no proptosis of pinna.  No mastoid tenderness or erythema. Eyes:     General: PERRL. Normal EOMs        Right eye: No discharge.        Left eye: No discharge.     Conjunctiva/sclera: Conjunctivae normal.  Cardiovascular:     Rate and Rhythm: Normal rate and regular rhythm.     Pulses: Normal pulses.  Pinpoint tenderness to palpation to right lateral chest wall.  No rash or skin changes noted. Pulmonary:     Effort: Pulmonary effort is normal. No respiratory distress.  Breath sounds: Normal breath sounds.  Abdominal:     Abdomen is soft. There is no abdominal tenderness. No rebound or guarding. No distention. Musculoskeletal:        General: No swelling. Normal range of motion.     Cervical back: Normal range of motion and neck supple.  No lower extremity swelling or pitting edema Skin:    General: Skin is warm and dry.     Capillary Refill: Capillary refill takes less than 2 seconds.     Findings: No rash.  Neurological:     Mental Status: The patient is awake and alert.      ED Results / Procedures / Treatments   Labs (all labs ordered are listed, but only abnormal results are displayed) Labs Reviewed  CBC - Abnormal; Notable for the following components:      Result Value   WBC 11.4 (*)    All other components within normal limits  BASIC METABOLIC PANEL WITH GFR     EKG     RADIOLOGY I independently reviewed and interpreted imaging and agree with radiologists findings.     PROCEDURES:  Critical Care performed:   Procedures   MEDICATIONS ORDERED IN ED: Medications  lidocaine  (LIDODERM ) 5 %  1 patch (1 patch Transdermal Patch Applied 06/04/24 1416)  ondansetron  (ZOFRAN -ODT) disintegrating tablet 4 mg (4 mg Oral Given 06/04/24 1415)     IMPRESSION / MDM / ASSESSMENT AND PLAN / ED COURSE  I reviewed the triage vital signs and the nursing notes.   Differential diagnosis includes, but is not limited to, COVID, URI, bronchitis, pneumonia, costochondritis, otitis media, otitis externa, eustachian tube dysfunction.  Patient is awake and alert, hemodynamically stable and afebrile.  He is nontoxic in appearance.  EKG obtained in triage reveals no acute ischemic signs or symptoms.  He has no chest pain currently, no chest pain on exertion, no shortness of breath, no diaphoresis, not consistent with acute coronary syndrome.  He reports that he has chest pain only when palpating the specific area on his right lateral chest wall, consistent with chest wall etiology such as costochondritis.  He has no pleurisy, hemoptysis, lower extremity swelling or pitting edema or pain, history of PE or DVT, recent periods of immobilization, recent trips or travel to suggest pulmonary embolism and his Wells score is 0.  Therefore pulmonary embolism is unlikely at this time.  Labs obtained in triage are overall reassuring.  Chest x-ray does not reveal any cardiopulmonary abnormality.  He was treated symptomatically with Toradol , Zofran , and Lidoderm  patch with significant improvement of the symptoms.  He does have a serous effusion noted on his ear exam, consistent with eustachian tube dysfunction in the setting of recent URI, he was started on Flonase .  I recommended close outpatient follow-up as well as strict return precautions.  Advise follow-up with ENT if his symptoms persist.  Patient understands and agrees   Patient's presentation is most consistent with acute complicated illness / injury requiring diagnostic workup.    FINAL CLINICAL IMPRESSION(S) / ED DIAGNOSES   Final diagnoses:  Viral URI with cough   Rib pain  Eustachian tube dysfunction, left     Rx / DC Orders   ED Discharge Orders          Ordered    lidocaine  (LIDODERM ) 5 %  Every 12 hours        06/04/24 1437    fluticasone  (FLONASE ) 50 MCG/ACT nasal spray  Daily  06/04/24 1437             Note:  This document was prepared using Dragon voice recognition software and may include unintentional dictation errors.   Latessa Tillis E, PA-C 06/04/24 1447    Viviann Pastor, MD 06/05/24 1140

## 2024-06-05 ENCOUNTER — Emergency Department: Payer: MEDICAID

## 2024-06-05 ENCOUNTER — Encounter: Payer: Self-pay | Admitting: Emergency Medicine

## 2024-06-05 ENCOUNTER — Emergency Department
Admission: EM | Admit: 2024-06-05 | Discharge: 2024-06-05 | Disposition: A | Discharge status: Home / Self Care | Payer: MEDICAID | Attending: Emergency Medicine | Admitting: Emergency Medicine

## 2024-06-05 ENCOUNTER — Other Ambulatory Visit: Payer: Self-pay

## 2024-06-05 DIAGNOSIS — R059 Cough, unspecified: Secondary | ICD-10-CM | POA: Diagnosis present

## 2024-06-05 DIAGNOSIS — R0602 Shortness of breath: Secondary | ICD-10-CM | POA: Insufficient documentation

## 2024-06-05 DIAGNOSIS — R0781 Pleurodynia: Secondary | ICD-10-CM | POA: Insufficient documentation

## 2024-06-05 DIAGNOSIS — J069 Acute upper respiratory infection, unspecified: Secondary | ICD-10-CM | POA: Diagnosis not present

## 2024-06-05 DIAGNOSIS — J029 Acute pharyngitis, unspecified: Secondary | ICD-10-CM

## 2024-06-05 DIAGNOSIS — J988 Other specified respiratory disorders: Secondary | ICD-10-CM

## 2024-06-05 LAB — COMPREHENSIVE METABOLIC PANEL WITH GFR
ALT: 32 U/L (ref 0–44)
AST: 22 U/L (ref 15–41)
Albumin: 3.8 g/dL (ref 3.5–5.0)
Alkaline Phosphatase: 79 U/L (ref 38–126)
Anion gap: 10 (ref 5–15)
BUN: 16 mg/dL (ref 6–20)
CO2: 28 mmol/L (ref 22–32)
Calcium: 8.9 mg/dL (ref 8.9–10.3)
Chloride: 105 mmol/L (ref 98–111)
Creatinine, Ser: 1.11 mg/dL (ref 0.61–1.24)
GFR, Estimated: >60 mL/min (ref >60)
Glucose, Bld: 110 mg/dL — ABNORMAL HIGH (ref 70–99)
Potassium: 3.4 mmol/L — ABNORMAL LOW (ref 3.5–5.1)
Sodium: 143 mmol/L (ref 135–145)
Total Bilirubin: 0.7 mg/dL (ref 0.0–1.2)
Total Protein: 6.7 g/dL (ref 6.5–8.1)

## 2024-06-05 LAB — CBC WITH DIFFERENTIAL/PLATELET
Abs Immature Granulocytes: 0.04 K/uL (ref 0.00–0.07)
Basophils Absolute: 0.1 K/uL (ref 0.0–0.1)
Basophils Relative: 1 %
Eosinophils Absolute: 0.2 K/uL (ref 0.0–0.5)
Eosinophils Relative: 2 %
HCT: 43.4 % (ref 39.0–52.0)
Hemoglobin: 14.7 g/dL (ref 13.0–17.0)
Immature Granulocytes: 0 %
Lymphocytes Relative: 28 %
Lymphs Abs: 2.6 K/uL (ref 0.7–4.0)
MCH: 28.3 pg (ref 26.0–34.0)
MCHC: 33.9 g/dL (ref 30.0–36.0)
MCV: 83.5 fL (ref 80.0–100.0)
Monocytes Absolute: 0.7 K/uL (ref 0.1–1.0)
Monocytes Relative: 8 %
Neutro Abs: 5.9 K/uL (ref 1.7–7.7)
Neutrophils Relative %: 61 %
Platelets: 249 K/uL (ref 150–400)
RBC: 5.2 MIL/uL (ref 4.22–5.81)
RDW: 12.8 % (ref 11.5–15.5)
WBC: 9.6 K/uL (ref 4.0–10.5)
nRBC: 0 % (ref 0.0–0.2)

## 2024-06-05 LAB — RESP PANEL BY RT-PCR (RSV, FLU A&B, COVID)  RVPGX2
Influenza A by PCR: NEGATIVE
Influenza B by PCR: NEGATIVE
Resp Syncytial Virus by PCR: NEGATIVE
SARS Coronavirus 2 by RT PCR: NEGATIVE

## 2024-06-05 LAB — GROUP A STREP BY PCR: Group A Strep by PCR: NOT DETECTED

## 2024-06-05 LAB — TROPONIN I (HIGH SENSITIVITY): Troponin I (High Sensitivity): <2 ng/L (ref <18)

## 2024-06-05 MED ORDER — DEXAMETHASONE 10 MG/ML FOR PEDIATRIC ORAL USE
10.0000 mg | Freq: Once | INTRAMUSCULAR | Status: AC
  Administered 2024-06-05: 10 mg via ORAL
  Filled 2024-06-05 (×2): qty 1

## 2024-06-05 MED ORDER — SODIUM CHLORIDE 0.9 % IV BOLUS
1000.0000 mL | Freq: Once | INTRAVENOUS | Status: AC
Start: 2024-06-05 — End: 2024-06-05
  Administered 2024-06-05: 1000 mL via INTRAVENOUS

## 2024-06-05 MED ORDER — LIDOCAINE 5 % EX PTCH
1.0000 | MEDICATED_PATCH | CUTANEOUS | Status: DC
  Administered 2024-06-05: 1 via TRANSDERMAL
  Filled 2024-06-05: qty 1

## 2024-06-05 NOTE — Discharge Instructions (Addendum)
 Your tests today are all normal.  Please pick up the medications that were sent to your pharmacy yesterday.  Please make sure to keep yourself hydrated.

## 2024-06-05 NOTE — ED Provider Notes (Signed)
 [facility] Provider Note    Event Date/Time   First MD Initiated Contact with Patient 06/05/24 [phone]     (approximate)   History   Sore Throat, Nasal Congestion, and Cough   HPI  Jackson Jimenez is a 33 y.o. male presenting with cough, rhinorrhea, congestion, sore throat, rib tenderness, shortness of breath.  States has been ongoing for several days, got worse yesterday.  Did come to the emergency department and was told he had a viral illness.  States that symptoms are not improving.  States that he woke up today feeling like his throat was tight.  When he got up, had an episode of nausea vomiting.  No leg swelling.  No history of high blood pressure or diabetes, no sick contacts.  No recent travel or surgeries, no prior cardiac issues, no history of blood clots or malignancies, no hormone use.  No hemoptysis.  On independent chart review, he was seen in the ED yesterday, had a CBC and BMP done, CBC shows mild leukocytosis, electrolytes not severely deranged.  He was discharged with prescriptions for Lidoderm  patches as well as Flonase  for outpatient, states that he had not picked up the medications.     Physical Exam   Triage Vital Signs: ED Triage Vitals  Encounter Vitals Group     BP 06/05/24 0558 (!) 120/90     Girls Systolic BP Percentile --      Girls Diastolic BP Percentile --      Boys Systolic BP Percentile --      Boys Diastolic BP Percentile --      Pulse Rate 06/05/24 0558 85     Resp 06/05/24 0558 18     Temp 06/05/24 0558 98.3 F (36.8 C)     Temp Source 06/05/24 0558 Oral     SpO2 06/05/24 0558 97 %     Weight 06/05/24 0559 260 lb 12.9 oz (118.3 kg)     Height 06/05/24 0559 6' (1.829 m)     Head Circumference --      Peak Flow --      Pain Score 06/05/24 0559 10     Pain Loc --      Pain Education --      Exclude from Growth Chart --     Most recent vital signs: Vitals:   06/05/24 0558 06/05/24 0615  BP: (!) 120/90 123/74   Pulse: 85 80  Resp: 18 20  Temp: 98.3 F (36.8 C)   SpO2: 97% 98%     General: Awake, no distress.  CV:  Good peripheral perfusion.  Resp:  Normal effort.  Clear Abd:  No distention.  Soft nontender Other:  No unilateral capsular tenderness, TMs are nonerythematous bilaterally, oropharynx is clear   ED Results / Procedures / Treatments   Labs (all labs ordered are listed, but only abnormal results are displayed) Labs Reviewed  COMPREHENSIVE METABOLIC PANEL WITH GFR - Abnormal; Notable for the following components:      Result Value   Potassium 3.4 (*)    Glucose, Bld 110 (*)    All other components within normal limits  GROUP A STREP BY PCR  RESP PANEL BY RT-PCR (RSV, FLU A&B, COVID)  RVPGX2  CBC WITH DIFFERENTIAL/PLATELET  TROPONIN I (HIGH SENSITIVITY)     EKG  EKG shows, sinus rhythm, rate 71, normal QRS, normal QTc, no obvious ischemic ST elevation, T wave inversion to 3, T wave inversions new compared to prior  RADIOLOGY On my independent interpretation, chest x-ray without obvious consolidation   PROCEDURES:  Critical Care performed: No  Procedures   MEDICATIONS ORDERED IN ED: Medications  lidocaine  (LIDODERM ) 5 % 1 patch (1 patch Transdermal Patch Applied 06/05/24 0628)  dexamethasone  (DECADRON ) 10 MG/ML injection for Pediatric ORAL use 10 mg (10 mg Oral Given 06/05/24 9367)  sodium chloride  0.9 % bolus 1,000 mL (1,000 mLs Intravenous New Bag/Given 06/05/24 0627)     IMPRESSION / MDM / ASSESSMENT AND PLAN / ED COURSE  I reviewed the triage vital signs and the nursing notes.                              Differential diagnosis includes, but is not limited to, viral illness, COVID, flu influenza, RSV, electrolyte derangements, atypical ACS, pneumonia.  Consider PE but patient is not hypoxic, not tachycardic, no other risk factors for it, he is PERC 0.  Will get labs, EKG, troponin, chest x-ray, viral swab, strep swab.  Will give him a Lidoderm  patch as  well as some Decadron  for the sore throat.  IV fluids.  Patient's presentation is most consistent with acute presentation with potential threat to life or bodily function.  Independent interpretation of labs and imaging below.  Patient signed out pending rest of labs, reassessment, likely be able to be discharged back home.  The patient is on the cardiac monitor to evaluate for evidence of arrhythmia and/or significant heart rate changes.   Clinical Course as of 06/05/24 0701  Wed Jun 05, 2024  0634 DG Chest 2 View 1. No acute process.  [TT]  [id number] Group A Strep by PCR: NOT DETECTED [TT]  0701 Independent review of labs, troponins not elevated, electrolytes not severely deranged, LFTs are normal, no leukocytosis, strep swab is negative. [TT]    Clinical Course User Index [TT] Waymond Lorelle Cummins, MD     FINAL CLINICAL IMPRESSION(S) / ED DIAGNOSES   Final diagnoses:  Upper respiratory tract infection, unspecified type  Cough, unspecified type  Sore throat  Rib pain  Shortness of breath  Congestion of upper airway     Rx / DC Orders   ED Discharge Orders          Ordered    Ambulatory Referral to Primary Care (Establish Care)        06/05/24 0656             Note:  This document was prepared using Dragon voice recognition software and may include unintentional dictation errors.    Waymond Lorelle Cummins, MD 06/05/24 [phone]

## 2024-06-05 NOTE — ED Triage Notes (Signed)
 Pt arrives POV c/o nasal congestion, productive cough, sore throat, and 1 episode of emesis upon wakening... continued sob. NADN.

## 2024-06-05 NOTE — ED Notes (Signed)
 ED Provider at bedside.

## 2024-06-07 ENCOUNTER — Other Ambulatory Visit: Payer: Self-pay

## 2024-06-07 ENCOUNTER — Emergency Department
Admission: EM | Admit: 2024-06-07 | Discharge: 2024-06-07 | Disposition: A | Discharge status: Home / Self Care | Payer: MEDICAID | Attending: Emergency Medicine | Admitting: Emergency Medicine

## 2024-06-07 ENCOUNTER — Emergency Department: Payer: MEDICAID

## 2024-06-07 ENCOUNTER — Encounter: Payer: Self-pay | Admitting: Emergency Medicine

## 2024-06-07 DIAGNOSIS — J029 Acute pharyngitis, unspecified: Secondary | ICD-10-CM | POA: Diagnosis present

## 2024-06-07 LAB — CBC WITH DIFFERENTIAL/PLATELET
Abs Immature Granulocytes: 0.06 K/uL (ref 0.00–0.07)
Basophils Absolute: 0.1 K/uL (ref 0.0–0.1)
Basophils Relative: 1 %
Eosinophils Absolute: 0.2 K/uL (ref 0.0–0.5)
Eosinophils Relative: 2 %
HCT: 43.7 % (ref 39.0–52.0)
Hemoglobin: 15 g/dL (ref 13.0–17.0)
Immature Granulocytes: 1 %
Lymphocytes Relative: 21 %
Lymphs Abs: 2.2 K/uL (ref 0.7–4.0)
MCH: 28.4 pg (ref 26.0–34.0)
MCHC: 34.3 g/dL (ref 30.0–36.0)
MCV: 82.6 fL (ref 80.0–100.0)
Monocytes Absolute: 0.7 K/uL (ref 0.1–1.0)
Monocytes Relative: 7 %
Neutro Abs: 7.2 K/uL (ref 1.7–7.7)
Neutrophils Relative %: 68 %
Platelets: 247 K/uL (ref 150–400)
RBC: 5.29 MIL/uL (ref 4.22–5.81)
RDW: 12.9 % (ref 11.5–15.5)
WBC: 10.3 K/uL (ref 4.0–10.5)
nRBC: 0 % (ref 0.0–0.2)

## 2024-06-07 LAB — RESP PANEL BY RT-PCR (RSV, FLU A&B, COVID)  RVPGX2
Influenza A by PCR: NEGATIVE
Influenza B by PCR: NEGATIVE
Resp Syncytial Virus by PCR: NEGATIVE
SARS Coronavirus 2 by RT PCR: NEGATIVE

## 2024-06-07 LAB — BASIC METABOLIC PANEL WITH GFR
Anion gap: 12 (ref 5–15)
BUN: 13 mg/dL (ref 6–20)
CO2: 25 mmol/L (ref 22–32)
Calcium: 8.6 mg/dL — ABNORMAL LOW (ref 8.9–10.3)
Chloride: 101 mmol/L (ref 98–111)
Creatinine, Ser: 0.77 mg/dL (ref 0.61–1.24)
GFR, Estimated: >60 mL/min (ref >60)
Glucose, Bld: 91 mg/dL (ref 70–99)
Potassium: 3.9 mmol/L (ref 3.5–5.1)
Sodium: 138 mmol/L (ref 135–145)

## 2024-06-07 LAB — GROUP A STREP BY PCR: Group A Strep by PCR: NOT DETECTED

## 2024-06-07 MED ORDER — SODIUM CHLORIDE 0.9 % IV SOLN
3.0000 g | Freq: Once | INTRAVENOUS | Status: AC
  Administered 2024-06-07: 3 g via INTRAVENOUS
  Filled 2024-06-07: qty 8

## 2024-06-07 MED ORDER — IOHEXOL 300 MG/ML  SOLN
75.0000 mL | Freq: Once | INTRAMUSCULAR | Status: AC | PRN
  Administered 2024-06-07: 75 mL via INTRAVENOUS

## 2024-06-07 MED ORDER — DEXAMETHASONE SODIUM PHOSPHATE 10 MG/ML IJ SOLN
10.0000 mg | Freq: Once | INTRAMUSCULAR | Status: AC
  Administered 2024-06-07: 10 mg via INTRAVENOUS
  Filled 2024-06-07: qty 1

## 2024-06-07 MED ORDER — SODIUM CHLORIDE 0.9 % IV BOLUS
1000.0000 mL | Freq: Once | INTRAVENOUS | Status: AC
  Administered 2024-06-07: 1000 mL via INTRAVENOUS

## 2024-06-07 MED ORDER — PREDNISONE 10 MG (21) PO TBPK: ORAL_TABLET | ORAL | 0 refills | Status: AC

## 2024-06-07 MED ORDER — AMOXICILLIN-POT CLAVULANATE 875-125 MG PO TABS: 1.0000 | ORAL_TABLET | Freq: Two times a day (BID) | ORAL | 0 refills | Status: AC

## 2024-06-07 NOTE — ED Provider Notes (Signed)
 [facility] Provider Note    Event Date/Time   First MD Initiated Contact with Patient 06/07/24 1433     (approximate)   History   Sore Throat   HPI  Jackson Jimenez is a 33 y.o. male with no past medical history presents emergency department complaining of severe sore throat.  This is patient's third visit to the ED for sore throat.  States not getting better feels like he is getting worse.  States feels like something is hung in his throat.  Making it very difficult to swallow.  No difficulty breathing.  No fever or chills.      Physical Exam   Triage Vital Signs: ED Triage Vitals [06/07/24 1323]  Encounter Vitals Group     BP (!) 148/99     Girls Systolic BP Percentile      Girls Diastolic BP Percentile      Boys Systolic BP Percentile      Boys Diastolic BP Percentile      Pulse Rate 73     Resp 17     Temp 98.4 F (36.9 C)     Temp Source Oral     SpO2 98 %     Weight 261 lb (118.4 kg)     Height 6' (1.829 m)     Head Circumference      Peak Flow      Pain Score 10     Pain Loc      Pain Education      Exclude from Growth Chart     Most recent vital signs: Vitals:   06/07/24 1323  BP: (!) 148/99  Pulse: 73  Resp: 17  Temp: 98.4 F (36.9 C)  SpO2: 98%     General: Awake, no distress.   CV:  Good peripheral perfusion. Resp:  Normal effort.  Abd:  No distention.   Other:  Throat appears to be a little red and inflamed, no peritonsillar abscess noted, no exudate noted, neck is supple, no lymphadenopathy, no dysphonia noted   ED Results / Procedures / Treatments   Labs (all labs ordered are listed, but only abnormal results are displayed) Labs Reviewed  BASIC METABOLIC PANEL WITH GFR - Abnormal; Notable for the following components:      Result Value   Calcium 8.6 (*)    All other components within normal limits  GROUP A STREP BY PCR  RESP PANEL BY RT-PCR (RSV, FLU A&B, COVID)  RVPGX2  CBC WITH  DIFFERENTIAL/PLATELET     EKG     RADIOLOGY CT soft tissue of the neck    PROCEDURES:   Procedures  Critical Care:  no Chief Complaint  Patient presents with   Sore Throat      MEDICATIONS ORDERED IN ED: Medications  Ampicillin -Sulbactam (UNASYN ) 3 g in sodium chloride  0.9 % 100 mL IVPB (has no administration in time range)  sodium chloride  0.9 % bolus 1,000 mL (1,000 mLs Intravenous New Bag/Given 06/07/24 1554)  dexamethasone  (DECADRON ) injection 10 mg (10 mg Intravenous Given 06/07/24 1551)  iohexol  (OMNIPAQUE ) 300 MG/ML solution 75 mL (75 mLs Intravenous Contrast Given 06/07/24 1609)     IMPRESSION / MDM / ASSESSMENT AND PLAN / ED COURSE  I reviewed the triage vital signs and the nursing notes.                              Differential diagnosis includes, but is  not limited to, retropharyngeal abscess, cellulitis, strep throat, COVID, foreign body  Patient's presentation is most consistent with acute illness / injury with system symptoms.    Medications given: Decadron  10 mg IV, normal saline 1 L IV, ampicillin  3 g IV  Labs reassuring  Due to the patient's continued discomfort and third visit here feel that at this time we should do further workup.  Did order CT soft tissue of the neck.  CT soft tissue of the neck shows pharyngeal edema but no abscess etc.  This was independently reviewed and interpreted by me as being positive for pharyngitis  Since the patient had some relief with the Decadron  I do feel that he may have some inflammation that we can treat with steroids and antibiotics.  Gave him Unasyn  3 g IV.  Will be given a prescription for Augmentin  and Sterapred at discharge.  Strict instructions to follow-up with ENT, return emergency department if worsening.  At this time I do not feel he needs admission and this can be handled as outpatient therapy.      FINAL CLINICAL IMPRESSION(S) / ED DIAGNOSES   Final diagnoses:  Acute pharyngitis,  unspecified etiology     Rx / DC Orders   ED Discharge Orders          Ordered    amoxicillin -clavulanate (AUGMENTIN ) 875-125 MG tablet  2 times daily        06/07/24 1653    predniSONE  (STERAPRED UNI-PAK 21 TAB) 10 MG (21) TBPK tablet        06/07/24 1653             Note:  This document was prepared using Dragon voice recognition software and may include unintentional dictation errors.    Gasper Devere ORN, PA-C 06/07/24 1741    Floy Roberts, MD 06/08/24 [phone]

## 2024-06-07 NOTE — ED Triage Notes (Signed)
 Patient to ED via POV for sore throat. Seen for same on 9/24- neg swabs.

## 2024-06-07 NOTE — Discharge Instructions (Addendum)
 Follow-up with ENT.  Please call for an appointment.  Return emergency department for worsening

## 2024-07-01 ENCOUNTER — Emergency Department: Payer: MEDICAID

## 2024-07-01 ENCOUNTER — Encounter: Payer: Self-pay | Admitting: Medical Oncology

## 2024-07-01 ENCOUNTER — Other Ambulatory Visit: Payer: Self-pay

## 2024-07-01 ENCOUNTER — Emergency Department
Admission: EM | Admit: 2024-07-01 | Discharge: 2024-07-01 | Disposition: A | Discharge status: Home / Self Care | Payer: MEDICAID | Attending: Emergency Medicine | Admitting: Emergency Medicine

## 2024-07-01 DIAGNOSIS — S99911A Unspecified injury of right ankle, initial encounter: Secondary | ICD-10-CM | POA: Diagnosis present

## 2024-07-01 DIAGNOSIS — S82831A Other fracture of upper and lower end of right fibula, initial encounter for closed fracture: Secondary | ICD-10-CM | POA: Diagnosis not present

## 2024-07-01 DIAGNOSIS — Z23 Encounter for immunization: Secondary | ICD-10-CM | POA: Insufficient documentation

## 2024-07-01 DIAGNOSIS — Y92410 Unspecified street and highway as the place of occurrence of the external cause: Secondary | ICD-10-CM | POA: Diagnosis not present

## 2024-07-01 DIAGNOSIS — T148XXA Other injury of unspecified body region, initial encounter: Secondary | ICD-10-CM

## 2024-07-01 DIAGNOSIS — S82891A Other fracture of right lower leg, initial encounter for closed fracture: Secondary | ICD-10-CM

## 2024-07-01 LAB — URINALYSIS, ROUTINE W REFLEX MICROSCOPIC
Bilirubin Urine: NEGATIVE
Glucose, UA: NEGATIVE mg/dL
Ketones, ur: NEGATIVE mg/dL
Leukocytes,Ua: NEGATIVE
Nitrite: NEGATIVE
Protein, ur: NEGATIVE mg/dL
Specific Gravity, Urine: 1.019 (ref 1.005–1.030)
Squamous Epithelial / HPF: 0 /HPF (ref 0–5)
pH: 6 (ref 5.0–8.0)

## 2024-07-01 LAB — COMPREHENSIVE METABOLIC PANEL WITH GFR
ALT: 54 U/L — ABNORMAL HIGH (ref 0–44)
AST: 59 U/L — ABNORMAL HIGH (ref 15–41)
Albumin: 3.8 g/dL (ref 3.5–5.0)
Alkaline Phosphatase: 78 U/L (ref 38–126)
Anion gap: 13 (ref 5–15)
BUN: 15 mg/dL (ref 6–20)
CO2: 22 mmol/L (ref 22–32)
Calcium: 8.3 mg/dL — ABNORMAL LOW (ref 8.9–10.3)
Chloride: 106 mmol/L (ref 98–111)
Creatinine, Ser: 0.83 mg/dL (ref 0.61–1.24)
GFR, Estimated: >60 mL/min (ref >60)
Glucose, Bld: 92 mg/dL (ref 70–99)
Potassium: 3.2 mmol/L — ABNORMAL LOW (ref 3.5–5.1)
Sodium: 141 mmol/L (ref 135–145)
Total Bilirubin: 0.6 mg/dL (ref 0.0–1.2)
Total Protein: 6.6 g/dL (ref 6.5–8.1)

## 2024-07-01 LAB — CBC
HCT: 39.9 % (ref 39.0–52.0)
Hemoglobin: 13.6 g/dL (ref 13.0–17.0)
MCH: 28.1 pg (ref 26.0–34.0)
MCHC: 34.1 g/dL (ref 30.0–36.0)
MCV: 82.4 fL (ref 80.0–100.0)
Platelets: 211 K/uL (ref 150–400)
RBC: 4.84 MIL/uL (ref 4.22–5.81)
RDW: 12.8 % (ref 11.5–15.5)
WBC: 8.4 K/uL (ref 4.0–10.5)
nRBC: 0 % (ref 0.0–0.2)

## 2024-07-01 LAB — PROTIME-INR
INR: 1 (ref 0.8–1.2)
Prothrombin Time: 13.7 s (ref 11.4–15.2)

## 2024-07-01 LAB — ETHANOL: Alcohol, Ethyl (B): <15 mg/dL (ref <15)

## 2024-07-01 LAB — LACTIC ACID, PLASMA: Lactic Acid, Venous: 0.9 mmol/L (ref 0.5–1.9)

## 2024-07-01 MED ORDER — MORPHINE SULFATE (PF) 4 MG/ML IV SOLN
4.0000 mg | Freq: Once | INTRAVENOUS | Status: AC
  Administered 2024-07-01: 4 mg via INTRAVENOUS
  Filled 2024-07-01: qty 1

## 2024-07-01 MED ORDER — IOHEXOL 300 MG/ML  SOLN
100.0000 mL | Freq: Once | INTRAMUSCULAR | Status: AC | PRN
  Administered 2024-07-01: 100 mL via INTRAVENOUS

## 2024-07-01 MED ORDER — BACITRACIN ZINC 500 UNIT/GM EX OINT
TOPICAL_OINTMENT | Freq: Once | CUTANEOUS | Status: AC
  Administered 2024-07-01: 1 via TOPICAL
  Filled 2024-07-01: qty 0.9

## 2024-07-01 MED ORDER — OXYCODONE HCL 5 MG PO TABS: 10.0000 mg | ORAL_TABLET | Freq: Three times a day (TID) | ORAL | 0 refills | Status: AC | PRN

## 2024-07-01 MED ORDER — DOCUSATE SODIUM 100 MG PO CAPS: 100.0000 mg | ORAL_CAPSULE | Freq: Two times a day (BID) | ORAL | 0 refills | Status: AC

## 2024-07-01 MED ORDER — ONDANSETRON 4 MG PO TBDP: 4.0000 mg | ORAL_TABLET | Freq: Four times a day (QID) | ORAL | 0 refills | Status: AC | PRN

## 2024-07-01 MED ORDER — TETANUS-DIPHTH-ACELL PERTUSSIS 5-2-15.5 LF-MCG/0.5 IM SUSP
0.5000 mL | Freq: Once | INTRAMUSCULAR | Status: AC
  Administered 2024-07-01: 0.5 mL via INTRAMUSCULAR
  Filled 2024-07-01: qty 0.5

## 2024-07-01 MED ORDER — FENTANYL CITRATE (PF) 50 MCG/ML IJ SOSY
50.0000 ug | PREFILLED_SYRINGE | Freq: Once | INTRAMUSCULAR | Status: AC
  Administered 2024-07-01: 50 ug via INTRAVENOUS
  Filled 2024-07-01: qty 1
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
## 2024-07-01 NOTE — ED Notes (Signed)
Ortho cart at the bedside

## 2024-07-01 NOTE — ED Notes (Signed)
 CCMD notified of cardiac monitoring order.
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
## 2024-07-01 NOTE — ED Notes (Signed)
 ED Provider at bedside.
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
## 2024-07-03 LAB — URINE CULTURE: Culture: NO GROWTH

## 2024-07-09 ENCOUNTER — Ambulatory Visit (INDEPENDENT_AMBULATORY_CARE_PROVIDER_SITE_OTHER): Payer: Self-pay

## 2024-07-09 ENCOUNTER — Encounter: Payer: Self-pay | Admitting: Podiatry

## 2024-07-09 ENCOUNTER — Ambulatory Visit (INDEPENDENT_AMBULATORY_CARE_PROVIDER_SITE_OTHER): Payer: Self-pay | Admitting: Podiatry

## 2024-07-09 VITALS — Ht 72.0 in | Wt 260.1 lb

## 2024-07-09 DIAGNOSIS — S8264XA Nondisplaced fracture of lateral malleolus of right fibula, initial encounter for closed fracture: Secondary | ICD-10-CM

## 2024-07-09 DIAGNOSIS — S99911D Unspecified injury of right ankle, subsequent encounter: Secondary | ICD-10-CM
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
## 2024-08-05 ENCOUNTER — Telehealth: Payer: Self-pay | Admitting: Podiatry

## 2024-08-05 NOTE — Telephone Encounter (Signed)
 email mess needing email addr for forms. I cld and lft mess on mom's email with email and fax. I adv also 25.00 fee for forms to be completed

## 2024-08-20 ENCOUNTER — Ambulatory Visit: Payer: Self-pay | Admitting: Podiatry

## 2024-08-23 ENCOUNTER — Telehealth: Payer: Self-pay | Admitting: Podiatry

## 2024-08-23 ENCOUNTER — Ambulatory Visit: Payer: Self-pay | Admitting: Podiatry

## 2024-08-23 ENCOUNTER — Encounter: Payer: Self-pay | Admitting: Podiatry

## 2024-08-23 ENCOUNTER — Ambulatory Visit: Payer: Self-pay

## 2024-08-23 DIAGNOSIS — S8264XA Nondisplaced fracture of lateral malleolus of right fibula, initial encounter for closed fracture: Secondary | ICD-10-CM

## 2024-08-23 NOTE — Telephone Encounter (Signed)
 Patient called and said that his job is asking for a work note that states that he is allowed to sit on a stool at work and that he can use 1 crutch with light pressure on his foot.
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
## 2024-09-10 ENCOUNTER — Encounter: Payer: Self-pay | Admitting: Podiatry

## 2024-09-10 ENCOUNTER — Telehealth: Payer: Self-pay | Admitting: Podiatry

## 2024-09-10 NOTE — Telephone Encounter (Signed)
 A letter was created, and the patient was contacted for pick-up. The patient stated that he went to the [facility] office and was informed they could not provide the letter. He was informed that I was in the process of completing the letter and that they may not have been aware. The patient then stated that his employer will not approve the listed work restrictions and that he will wait to follow up with you at his appointment on 09/17/24.

## 2024-09-10 NOTE — Telephone Encounter (Signed)
 The patient is requesting that his work note be modified to allow him to return to work at this time with the restriction of wearing a boot and remaining seated with no walking required during work hours.

## 2024-09-17 ENCOUNTER — Encounter: Payer: Self-pay | Admitting: Podiatry

## 2024-09-17 ENCOUNTER — Ambulatory Visit (INDEPENDENT_AMBULATORY_CARE_PROVIDER_SITE_OTHER)

## 2024-09-17 ENCOUNTER — Ambulatory Visit: Payer: Self-pay | Admitting: Podiatry

## 2024-09-17 DIAGNOSIS — S8264XA Nondisplaced fracture of lateral malleolus of right fibula, initial encounter for closed fracture: Secondary | ICD-10-CM
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
